# Patient Record
Sex: Male | Born: 1980 | Race: White | Hispanic: No | Smoking: Current every day smoker
Health system: Southern US, Community
[De-identification: ages and names within clinical notes are randomized; demographics above are authoritative.]

## PROBLEM LIST (undated history)

## (undated) DIAGNOSIS — F32A Depression, unspecified: Secondary | ICD-10-CM

## (undated) DIAGNOSIS — K219 Gastro-esophageal reflux disease without esophagitis: Secondary | ICD-10-CM

## (undated) DIAGNOSIS — H9190 Unspecified hearing loss, unspecified ear: Secondary | ICD-10-CM

## (undated) DIAGNOSIS — F1010 Alcohol abuse, uncomplicated: Secondary | ICD-10-CM

## (undated) DIAGNOSIS — F419 Anxiety disorder, unspecified: Secondary | ICD-10-CM

## (undated) DIAGNOSIS — F410 Panic disorder [episodic paroxysmal anxiety] without agoraphobia: Secondary | ICD-10-CM

## (undated) DIAGNOSIS — K769 Liver disease, unspecified: Secondary | ICD-10-CM

## (undated) DIAGNOSIS — F329 Major depressive disorder, single episode, unspecified: Secondary | ICD-10-CM

## (undated) HISTORY — DX: Panic disorder (episodic paroxysmal anxiety): F41.0

## (undated) HISTORY — PX: COCHLEAR IMPLANT: SHX184

## (undated) HISTORY — DX: Alcohol abuse, uncomplicated: F10.10

## (undated) HISTORY — DX: Gastro-esophageal reflux disease without esophagitis: K21.9

## (undated) HISTORY — DX: Liver disease, unspecified: K76.9

## (undated) HISTORY — PX: COLON SURGERY: SHX602

## (undated) HISTORY — PX: OTHER SURGICAL HISTORY: SHX169

---

## 2006-01-16 ENCOUNTER — Other Ambulatory Visit: Payer: Self-pay

## 2006-01-16 ENCOUNTER — Emergency Department: Payer: Self-pay | Admitting: Emergency Medicine

## 2006-10-24 ENCOUNTER — Emergency Department: Payer: Self-pay | Admitting: Emergency Medicine

## 2006-10-24 ENCOUNTER — Other Ambulatory Visit: Payer: Self-pay

## 2006-11-23 ENCOUNTER — Other Ambulatory Visit: Payer: Self-pay

## 2006-11-23 ENCOUNTER — Emergency Department: Payer: Self-pay | Admitting: Emergency Medicine

## 2007-01-07 ENCOUNTER — Ambulatory Visit: Payer: Self-pay | Admitting: Gastroenterology

## 2007-02-25 ENCOUNTER — Ambulatory Visit: Payer: Self-pay | Admitting: Family Medicine

## 2007-02-28 ENCOUNTER — Ambulatory Visit: Payer: Self-pay | Admitting: Family Medicine

## 2007-12-08 ENCOUNTER — Ambulatory Visit: Payer: Self-pay | Admitting: Family Medicine

## 2008-05-22 IMAGING — CT CT ABDOMEN W/ CM
1 of 2 series · 15 of 32 positions shown, 19 images · non-contrast
Comparison: none

REASON FOR EXAM: abd pain    dialated common bile duct
COMMENTS:

[Series 2: soft tissue · axial · 0.78mm/px · z∈[-72,+248]mm · 15 of 89 slices shown, 19 images]
[im 5/89  soft-tissue]
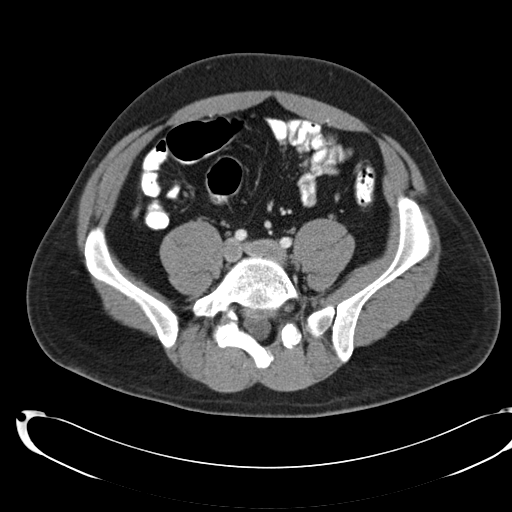
[im 5/89  bone]
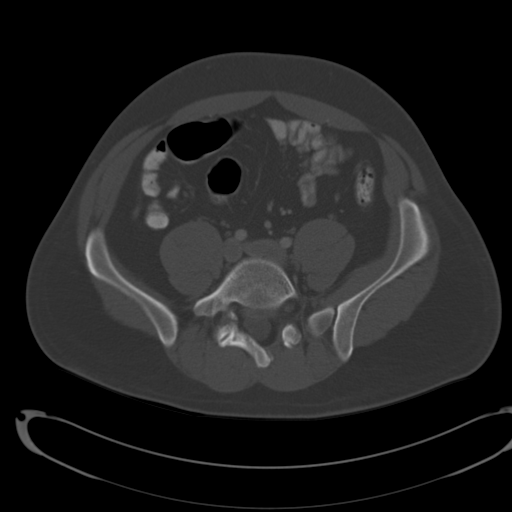
[im 13/89  soft-tissue]
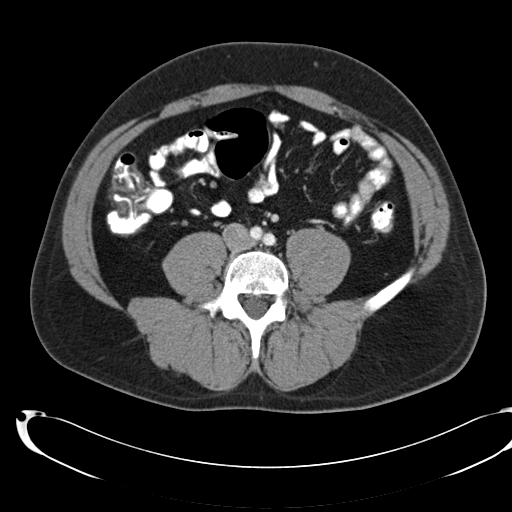
[im 21/89  soft-tissue]
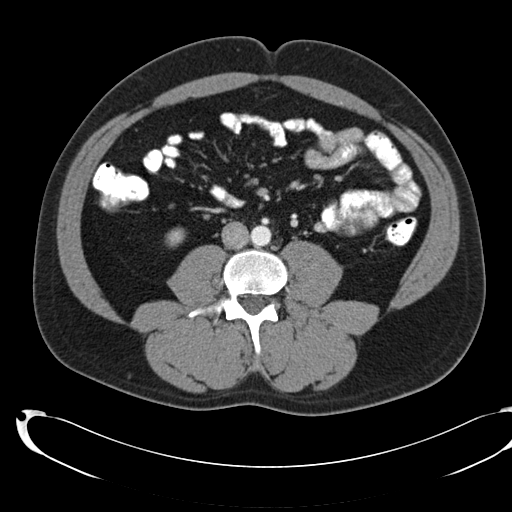
[im 25/89  soft-tissue]
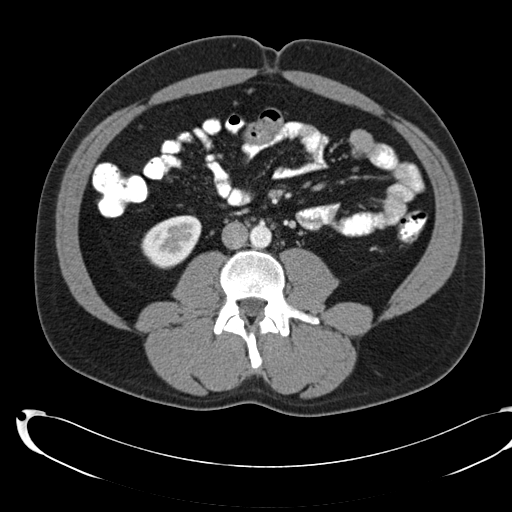
[im 33/89  soft-tissue]
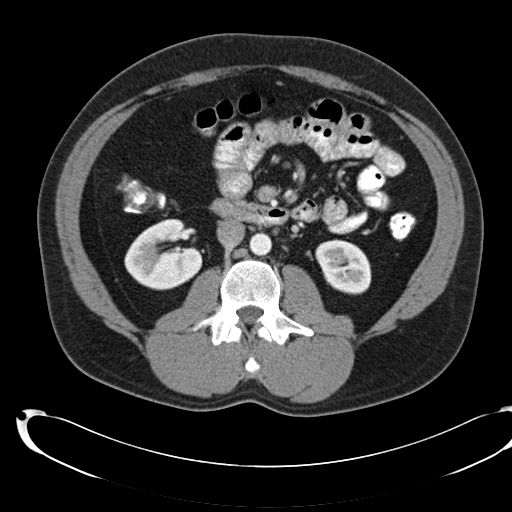
[im 37/89  soft-tissue]
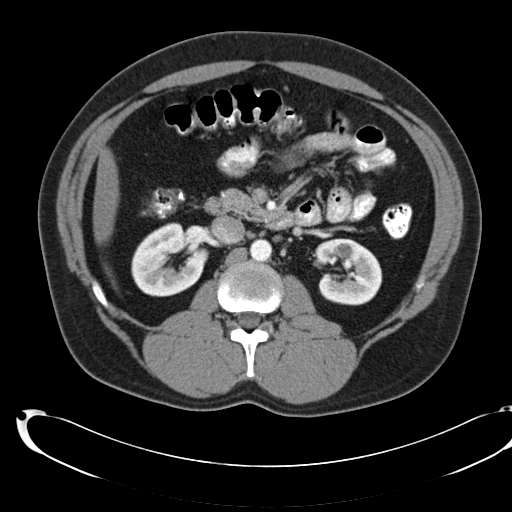
[im 45/89  soft-tissue]
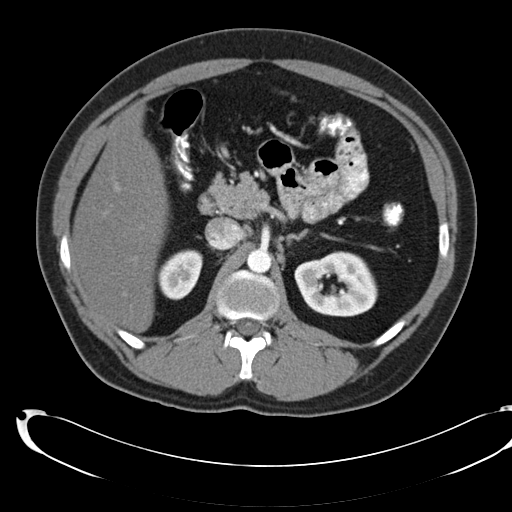
[im 53/89  soft-tissue]
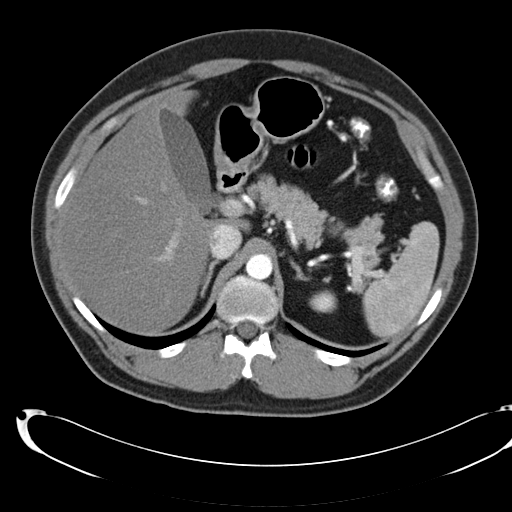
[im 57/89  soft-tissue]
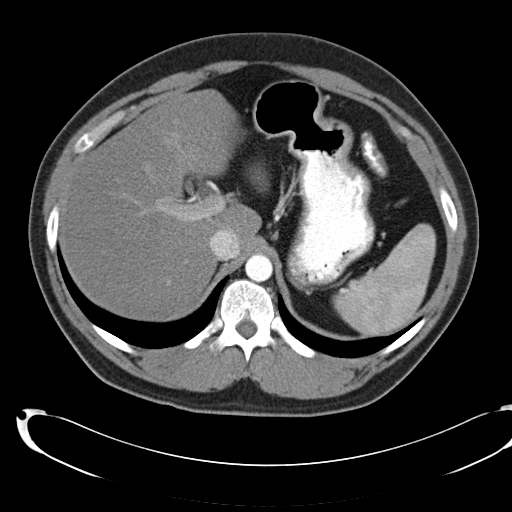
[im 57/89  bone]
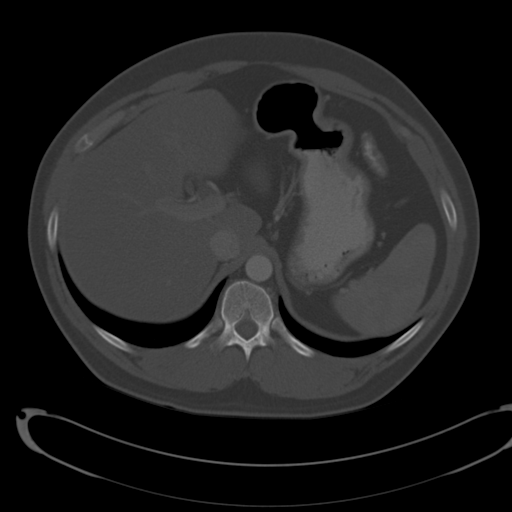
[im 65/89  soft-tissue]
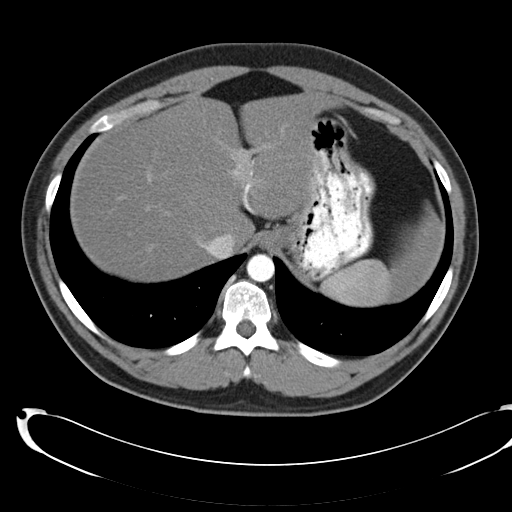
[im 69/89  soft-tissue]
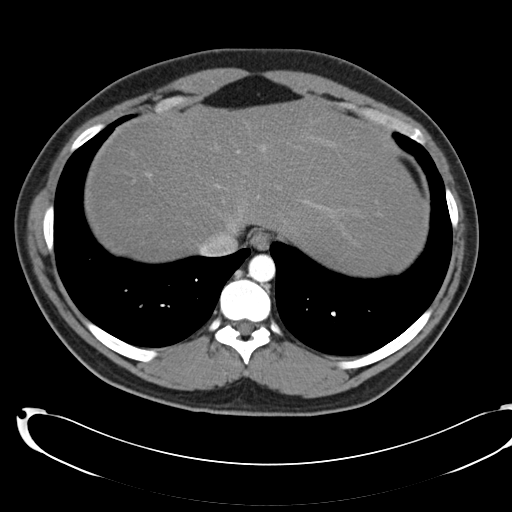
[im 73/89  lung]
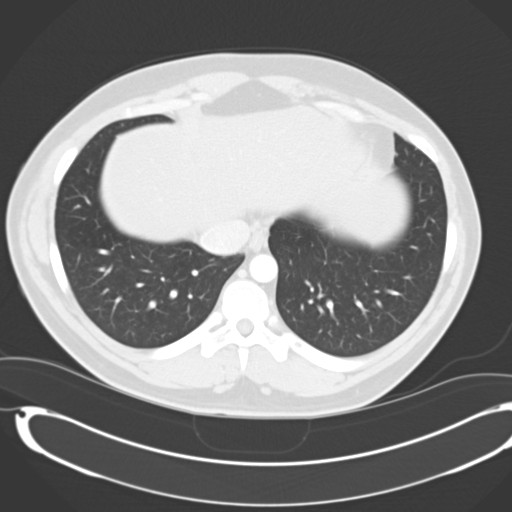
[im 77/89  soft-tissue]
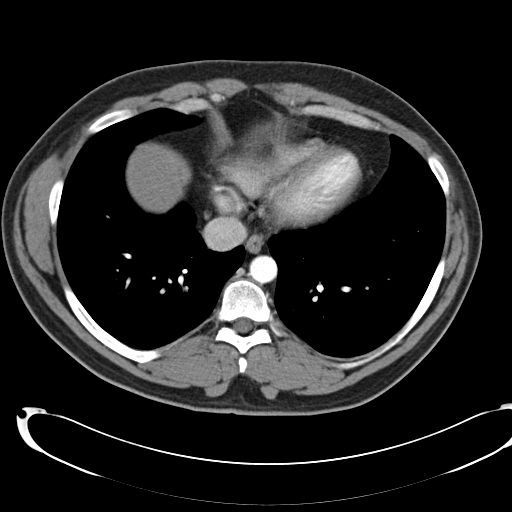
[im 77/89  lung]
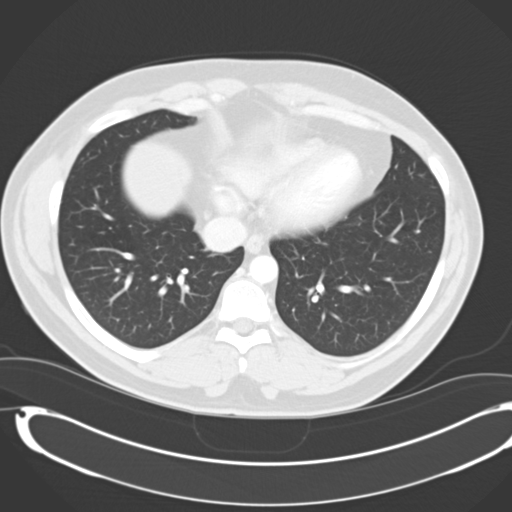
[im 81/89  lung]
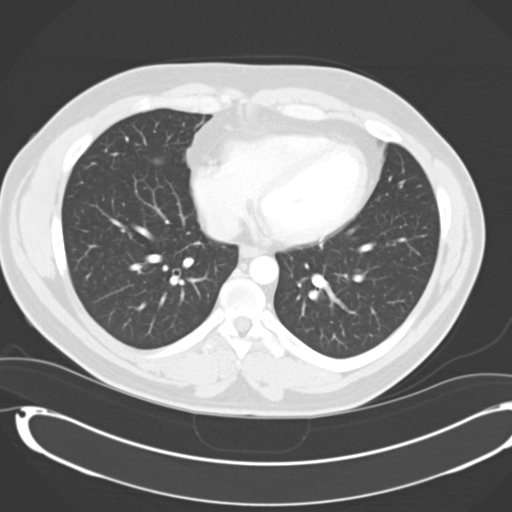
[im 85/89  soft-tissue]
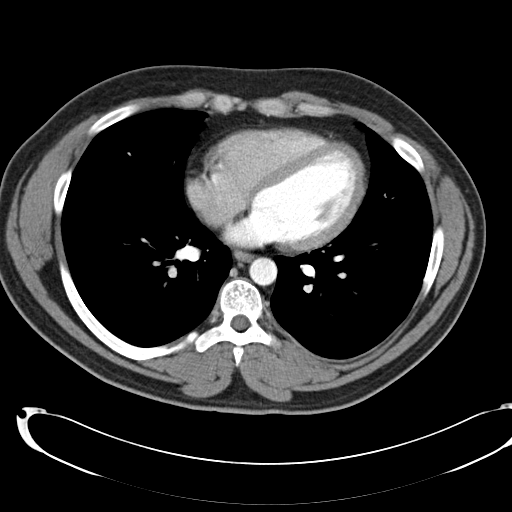
[im 85/89  lung]
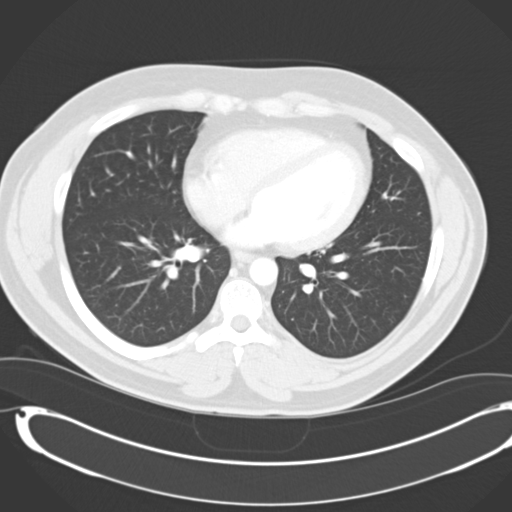

[15 of 32 positions shown; findings below may reference images not displayed]

PROCEDURE:     CT  - CT ABDOMEN COMPLEX W  - February 28, 2007  [DATE]

RESULT:       Helical 4-mm sections were obtained from the lung bases
through the superior iliac crest status post intravenous administration of
80 ml of Tsovue-VMS and oral contrast.

Evaluation of the lung bases demonstrates no gross abnormalities. The liver
demonstrates a diffuse low attenuating architecture. No focal masses or
regions of abnormal enhancement are identified. The spleen, adrenals and
kidneys are unremarkable. Evaluation of the pancreas demonstrates no
evidence of abnormal parenchymal enhancement or evidence of focal masses,
peripancreatic fluid or drainable loculated fluid collections. The pancreas
appears to demonstrate a normal undulating border. The pancreatic duct does
not appear to be dilated. There does not appear to be CT evidence of
intrahepatic biliary ductal dilatation. The common bile duct is prominent at
approximate 8-mm in diameter correlating with the patient's ultrasound
findings. No calcified gallstones are demonstrated within the gallbladder or
within the visualized portions of the common bile duct. The celiac, SMA,
portal vein, SMV and IMA are patent. There is no evidence of abdominal free
fluid, drainable loculated fluid collections, masses or adenopathy.
The visualized portions of the bowel demonstrates no evidence of
obstruction, colitis or enteritis.
IMPRESSION: 1.     Findings consistent with hepatic steatosis.
2.     There is no CT evidence of pancreatitis or pancreatic masses.

## 2008-09-07 ENCOUNTER — Ambulatory Visit: Payer: Self-pay | Admitting: Unknown Physician Specialty

## 2008-09-10 ENCOUNTER — Ambulatory Visit: Payer: Self-pay | Admitting: Gastroenterology

## 2008-10-09 ENCOUNTER — Ambulatory Visit: Payer: Self-pay | Admitting: Gastroenterology

## 2008-10-18 ENCOUNTER — Ambulatory Visit (HOSPITAL_COMMUNITY): Admission: RE | Admit: 2008-10-18 | Discharge: 2008-10-19 | Payer: Self-pay | Admitting: Otolaryngology

## 2009-03-15 ENCOUNTER — Emergency Department: Payer: Self-pay | Admitting: Emergency Medicine

## 2009-10-16 ENCOUNTER — Ambulatory Visit: Payer: Self-pay | Admitting: Family Medicine

## 2010-11-27 LAB — CBC
MCV: 86.8 fL (ref 78.0–100.0)
Platelets: 273 10*3/uL (ref 150–400)
RBC: 4.79 MIL/uL (ref 4.22–5.81)
WBC: 6.7 10*3/uL (ref 4.0–10.5)

## 2010-11-27 LAB — COMPREHENSIVE METABOLIC PANEL
ALT: 88 U/L — ABNORMAL HIGH (ref 0–53)
AST: 39 U/L — ABNORMAL HIGH (ref 0–37)
Albumin: 4 g/dL (ref 3.5–5.2)
Calcium: 9.6 mg/dL (ref 8.4–10.5)
Chloride: 107 mEq/L (ref 96–112)
Creatinine, Ser: 1.16 mg/dL (ref 0.4–1.5)
GFR calc Af Amer: >60 mL/min (ref >60)
Sodium: 141 mEq/L (ref 135–145)

## 2010-11-27 LAB — DIFFERENTIAL
Eosinophils Absolute: 0.3 10*3/uL (ref 0.0–0.7)
Eosinophils Relative: 4 % (ref 0–5)
Lymphocytes Relative: 30 % (ref 12–46)
Lymphs Abs: 2 10*3/uL (ref 0.7–4.0)
Monocytes Absolute: 0.6 10*3/uL (ref 0.1–1.0)

## 2010-11-27 LAB — URINALYSIS, ROUTINE W REFLEX MICROSCOPIC
Glucose, UA: NEGATIVE mg/dL
Specific Gravity, Urine: 1.027 (ref 1.005–1.030)
pH: 7.5 (ref 5.0–8.0)

## 2010-12-30 NOTE — Op Note (Signed)
NAME:  [name], [name] NO.:  [id number]   MEDICAL RECORD NO.:  [id number]          PATIENT TYPE:  OIB   LOCATION:  5526                         FACILITY:  [facility]   PHYSICIAN:  Carolan Shiver, M.D.    DATE OF BIRTH:  Apr 10, 1981   DATE OF PROCEDURE:  DATE OF DISCHARGE:                               OPERATIVE REPORT   JUSTIFICATION FOR PROCEDURE:  Jonathan [name] is a 30 year old white male  here today for a right cochlear implant to treat profound bilateral  sensorineural hearing loss.  Jonathan [name] first presented to see me on  August 27, 2003, at that time he had a 5-year history of progressive  hearing loss.  He stated that he was having great difficulty hearing low  pitches.  He had some childhood ear infections, but denied any head  trauma or previous ear procedures.  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  He had been seen by Dr. Andee Poles in  [place] and was told that he should wear BTE hearing aids, but  elected to purchase in-the-ear hearing aids.  At that time, he was not  wearing them.  On physical examination, he was found to have intact  tympanic membranes.  Audiometric testing at that time documented steeply  sloping and profound high-frequency sensorineural hearing losses.  He  had speech reception threshold of 60% in his left ear and we did not  test discrimination in the right ear.  Over the last 5 years, he went  onto lose most of his hearing in the high frequencies as well as hearing  in the low frequencies in the right ear, continued to have a low  shoulder audiogram on the left.  Preop audiometric testing documented  speech reception threshold to 75 dB right ear with 12% discrimination  and 65 dB SRT in the left with 28% discrimination.  He met all criteria  for implantation with a multichannel cochlear implant right ear.   He underwent CT scanning of his  temporal bones on March 06, 2008, at  [facility].  He was found to have normal temporal bone  anatomy with patent cochleas.  ENG was performed which showed some  borderline normal tracking.  He had 38% difference between his right and  left ears on vestibular testing.  With ice water calorics, he had 50  degrees maximum slow-wave velocity in right ear versus 31 degrees  maximum slow-wave velocity in left ear.  He had excellent visual  fixation suppression present for both ears.   The patient and his wife were extensively counseled that he could do  nothing or consider monaural cochlear implant followed by eventually  binaural cochlear implantation.  He elected to proceed with a binaural  cochlear implant right ear.   Risks, complications, and alternatives were explained to the patient and  to his wife.  Questions were invited and answered and informed consent  was signed and witnessed.  He was extensively counseled preoperatively  watch the  [employer] DVD presentation concerning multichannel  cochlear implantation.  He understood that we could not guarantee that  he would be able to hear with a cochlear implant and that there were  both low medium and high performers.  He understood it would take up to  24 months for him to learn how to use the device and again that there  were no guarantees that he would regain any useful hearing.  He  understood this and wanted to proceed.  He read our three-page informed  consent on cochlear implant because he could not hear, but he could  read.  He was recommended for Pneumovax vaccine.   His procedure was scheduled for October 18, 2008, at [facility] OR under  general endotracheal anesthesia.   PREOPERATIVE DIAGNOSIS:  Profound bilateral sensorineural deafness.   POSTOPERATIVE DIAGNOSIS:  Profound bilateral sensorineural deafness.   OPERATION:  Advanced Bionics HiRes 90K with one J electrode multichannel  cochlear implant, right  ear.   SURGEON:  Carolan Shiver, MD   ANESTHESIA:  General endotracheal, Dr. Laverle Hobby, MD   COMPLICATIONS:  None.   SUMMARY OF REPORT:  After the patient was taken to the operating room,  he was placed in the supine position.  An IV had been begun in the preop  holding area.  General IV induction was then performed under the  guidance of Dr. Laverle Hobby, and the patient was orally intubated.  Eyelids were taped shut, and he was properly positioned and monitored.  Elbows and ankles were padded with foam rubber, and a Foley catheter was  inserted.  PAS stockings were applied.   The hair was clipped in the left postauricular area, the hair was taped,  and a stocking cap was applied.  A lazy S-incision was then made in the  right postauricular area in a standard fashion for a cochlear implant.  The area was then infiltrated with 10 mL of 1% Xylocaine with 1:100,000  epinephrine.  Silver wire needle electrodes were inserted into the right  orbicularis oris and oculi muscles, the suprasternal notch, and  connected to a NIM facial nerve preamplifier.  The patient's right ear  and hemiface and scalp were then prepped with Betadine and draped in a  standard fashion for a multichannel cochlear implant.   A right postauricular incision was then made and carried down to  mucoperiosteum.  Superior portion of the incision was then made, carried  down to temporalis muscle.  A anteriorly based temporalis muscle flap  was then elevated and the mastoid cortex was exposed.  The mastoid  cortex was then removed with cutting burs using continuous suction  irrigation.  The sigmoid sinus was identified as was the antrum.  The  digastric periosteum was identified and the posterior canal wall was  thinned.  The antrum was opened and the incus was identified in the  fossa incudis.  The facial recess was then opened with a 2-mm cutting  bur and then a 2-mm diamond bur.  He had normal anatomy with a   pneumatized facial recess.  The round window niche was identified.  A  cochleostomy was then performed anterior to the round window niche and  the scala tympani was egg shelled.  The bone was then removed, and the  cochleostomy was enlarged to the size of the plastic insertion tube.  This was also checked with a sizer.  The area was then packed with  cottonoid soaked in saline.   Attention was  then turned to the temporoparietal skull.  A EBC ICS  template was then used to mark a bony well.  Great care was taken to  make sure the ICS was moved posteriorly enough so that he could wear an  external processor without difficulty.  A well was drilled in the bone  and bleeding was controlled with bone wax.  A trough was drilled from  the anterior portion of the well to the posterosuperior portion of the  mastoid cavity.  All edges were smoothed and the posterior edge of the  mastoid cavity was undercut.  A site was then copiously irrigated with  bacitracin-containing saline.  All bleeding was controlled.  Gowns and  gloves were changed.   An Advanced Bion[employer] HiRes 90K cochlear implant with a one J electrode,  serial [id number].  Sterilization [phone] was then opened under saline and  then inspected under the microscope.  The ICS appeared to be intact.  The implant was then placed in the postauricular pocket with the  titanium can of the ICS in the bony well.  Two sets of holes had been  previously drilled on either side of the bony well with a 1-mm cutting  bur.  The cochlear implant was sutured into the bone using a 5-0  Surgilon suture.   The plastic insertion tube was then mounted on an insertion tool with  the collar of the insertion tube fully mounted to the collar of the  insertion tool.  Slot was oriented at 10 o'clock.  Insertion tube was  then inserted into the at basilar turn of the cochlea and the implant  was gently inserted without tension into the cochlea.  The insertion  tube was  disengaged from the electrode array without difficulty with the  aid of a straight claw.  The J-hook portion of the electrode array was  at the cochleostomy.  Cochleostomy was then packed and obliterated with  small fascia rectangles and then larger pieces of fascia.  The facial  recess was then obliterated with 2 muscle plugs.  These were then held  in place with Gelfoam soaked in bacitracin saline.  Two other rectangles  of Gelfoam were used to stabilize the electrode array within the mastoid  cavity.  Proximal portion of the electrode array was placed under the  undercut shelf of the posterior edge of the mastoid cavity.  The muscle  flap was then closed over the electrode array that had been placed in  the bony trough.  It was sutured into place with interrupted 3-0  Monocryl.  Skin was then closed in 2 layers using interrupted inverted 3-  0 Monocryl for subcutaneous watertight closure and skin was closed with  interrupted skin staples.  Intraoperative x-ray was taken and showed the  electrode array to be within good position in the cochlea.   The incision was then painted with bacitracin ointment.  The ear was  padded with Telfa and cotton.  I should mention that a #7 Jackson-Pratt  drain had been inserted through a separate stab incision, sutured into  place with a 2-0 silk suture prior to closing the incision.  A JP drain  was connected to a grenade suction.  The ear was padded with Telfa and  cotton, and standard adult Glasscock mastoid dressing was applied  loosely in the standard fashion.  The patient's Foley catheter was  removed.  He was awakened, extubated, and transferred to his hospital  bed.  He appeared to tolerate the general  endotracheal anesthesia and  the procedures well and left the operating room in stable condition.   TOTAL FLUIDS:  2 L.   TOTAL URINE OUTPUT:  225 mL.   ESTIMATED BLOOD LOSS:  Less than 30 mL.   Sponge, needle, and cotton ball counts were  correct at the termination  of the procedure.  There were no complications.   Jonathan [name] will be admitted to the PACU and then to a private room.  He  will be observed overnight and discharged on October 19, 2008, if stable.  His wife will be instructed to have him return to my office in  approximately 7-10 days for followup and staple removal.  His external  processor will be connected and programmed in approximately 3 weeks.   SUMMARY:  Uncomplicated [employer]ion[employer] cochlear implant right ear with  a HiRes 90K with one J electrode ICS serial [id number].  Full-length  insertion was accomplished.  The patient had a pneumatized facial  recess.  The facial nerve was not exposed.  Middle ear anatomy was  normal.  An intraoperative x-ray documented proper insertion in the  electrode array within the cochlea.  The ICS was sutured to bone with a  5-0 Nurolon.  There were no complications.      Carolan Shiver, M.D.  Electronically Signed     EMK/MEDQ  D:  10/18/2008  T:  10/19/2008  Job:  [id number]

## 2010-12-30 NOTE — H&P (Signed)
NAME:  Jonathan [name], TULLY NO.:  [id number]   MEDICAL RECORD NO.:  [id number]          PATIENT TYPE:  AMB   LOCATION:                               FACILITY:  [facility]   PHYSICIAN:  Carolan Shiver, M.D.    DATE OF BIRTH:  07/30/1981   DATE OF ADMISSION:  10/18/2008  DATE OF DISCHARGE:                              HISTORY & PHYSICAL   CHIEF COMPLAINT:  Profound bilateral sensorineural hearing loss.   HISTORY OF PRESENT ILLNESS:  Jonathan [name] is a 30 year old white  male, being admitted to [facility] on October 18, 2008, for a  multichannel cochlear implant, right ear, to treat profound bilateral  sensorineural hearing loss.  The etiology of [name] hearing loss is  unknown.  It has been slow and progressive.  He was first evaluated by  me on August 27, 2003.  He had had a 5-year history of progressive  hearing loss at that time.  Therefore, he has had a 10-year history of  progressive hearing loss.  He had had some childhood ear infections but  denied any ear operations or head trauma.  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  He had been fit with binaural hearing  aids, which eventually provided him with no benefit.  Over the last 5  years, he has gone on to lose most of his hearing.  He still had a small  amount of residual hearing in his left ear and essentially no hearing in  the right ear.  Preop audiometric testing on October 16, 2008, documented  speech reception threshold to 75 dB, right ear with 12% discrimination  and 65 dB SRT in the left ear with 28% discrimination.  He was,  otherwise, in good health.  Over the last several years, he had been  counseled extensively concerning the risks, benefits, and alternatives,  a multichannel cochlear implantation, and he elected to proceed with an  implant in the right ear.  His hearing aids were providing no benefit.  Preop ENG on March 09, 2008, documented 38% difference in ice water  caloric function of each labyrinth.  He had a 50 degree per second right  ear, 31 degrees per second left ear.  CT scan of his temporal bones  which has been performed on March 06, 2008, showed normal left temporal  bones and patent cochleas.   [name] and his wife are extensively counseled concerning the risks,  benefits, and alternatives of multichannel cochlear implantation right  ear.  He had watched the advanced bionics DVD and had been supplied with  all of the advanced bionics information.  Over the last year, he had  been extensively counseled and understood that there were no guarantees  that he would hear with a cochlear implant.  He also understood that  there were one third patients were low performer and one third medium  performers and a third high performed and that I did not know in  which  category he would fall.   Because he cannot hear but he can read, he was given a 3-page written  informed consent to read prior to the procedure and he agreed to proceed  with the operation.   The patient was counseled on October 16, 2008, that risks and complications  included but were not limited to infection, bleeding, reaction to the  anesthesia, partial or total loss of hearing, facial weakness or  paralysis, transient or prolonged dizziness, transient or prolonged  taste disturbance, transient or prolonged tinnitus.  Possible failure of  the internal hardware and need for explantation and reimplantation and  the fact that we could not guarantee any hearing result with the  implant.  He understood all of this and wanted to proceed with the  procedure.  Procedure is scheduled for October 18, 2008, at 7:30 a.m. at  [facility] Operating Room under general endotracheal anesthesia.   PAST MEDICAL HISTORY:  No serious illnesses or operations reported.   MEDICATIONS:  Nexium and Valium p.r.n.   ALLERGIES TO  MEDICATIONS:  None reported.   FAMILY HISTORY:  Positive for diabetes, heart disease, and hearing loss.   SOCIAL HISTORY:  He is married, works as a Estate agent, has some  college education, smokes 1-2 times per week, does drink alcohol 1-2  times per month.   REVIEW OF SYSTEMS:  Positive for his progressive hearing loss, high  cholesterol, and reflux.   PHYSICAL EXAMINATION:  GENERAL:  He was awake, alert, and stable.  HEENT:  His head was normocephalic with bilateral symmetric facial  motion.  He had no signs of any syndromes.  He was markedly hearing  impaired.  Eyes, PERRLA with EOMI.  External ears and canals were  stable.  TMs were clear and mobile bilaterally.  Nose was negative.  External and internally oral cavity, lips, and palate normal.  NECK:  Negative.  CHEST:  Clear to percussion and auscultation.  HEART:  Normal sinus rhythm.  ABDOMEN:  Benign.  GENITALIA AND RECTAL:  Not done.  EXTREMITIES:  Unremarkable.  NEUROLOGIC:  Physiologic with the exception of a very poor function of  his eighth cranial nerves bilaterally.   He appeared to have realistic expectations.  Again, his wife accompanied  him to the examination.   LABORATORY DATA:  From October 17, 2008, hemoglobin was 14.4, hematocrit  41.6, white blood cell count 6700, platelet count was 273,000.  PT was  13.2, PTT 27, INR 1.0.  Serum electrolytes were within normal limits  with potassium of 4.3.  A urinalysis was unremarkable.  Chest x-ray and  EKG results are pending.   Preop CT scan of his temporal bones on March 06, 2008, at [facility] revealed normal temporal bone anatomy within patent cochleae  bilaterally and normal internal auditory canals.  Preop ENG on March 09, 2008, at the [facility] revealed a normal ENG with some borderline  normal tracking.  On ice water caloric irrigation of his right ear, he  had a 50 degree maximum slow wave velocity, 50 degrees per second  maximum slow wave  velocity right ear and 31 degree per second maximum  slow wave velocity left ear with a 38% difference between the 2 ears.   IMPRESSION:  Profound bilateral sensorineural deafness, right greater  than left, with failure of hearing aids to provide any benefit.   RECOMMENDATIONS:  The patient is a candidate for a multichannel cochlear  implant, right ear, under general  endotracheal anesthesia with a 23-hour  overnight observation.  Again risks, complications, and alternatives  were explained to him and to his wife.  He also read our 3-page cochlear  implant informed consent form and signed our consent.  He is scheduled  for the morning of October 18, 2008, at [facility] [place], under general  endotracheal anesthesia.      Carolan Shiver, M.D.  Electronically Signed    EMK/MEDQ  D:  10/17/2008  T:  10/18/2008  Job:  [id number]

## 2011-01-02 NOTE — Discharge Summary (Signed)
NAME:  Jonathan, [name] NO.:  [id number]   MEDICAL RECORD NO.:  [id number]          PATIENT TYPE:  OIB   LOCATION:  5526                         FACILITY:  [facility]   PHYSICIAN:  Carolan Shiver, M.D.    DATE OF BIRTH:  1981/08/02   DATE OF ADMISSION:  10/18/2008  DATE OF DISCHARGE:  10/19/2008                               DISCHARGE SUMMARY   ADMISSION DIAGNOSIS:  Profound bilateral sensorineural hearing loss,  unable to be helped by hearing aids.   DISCHARGE DIAGNOSIS:  Profound bilateral sensorineural hearing loss,  unable to be helped by hearing aids.   OPERATION:  Advanced Bionics cochlear implant, right ear (HiRes 90 K  with 1J electrode).   SURGEON:  Carolan Shiver, MD   ANESTHESIA:  General endotracheal, Dr. Diamantina Monks.   COMPLICATIONS:  None.   DISCHARGE STATUS:  Stable.   SUMMARY OF HOSPITALIZATION:  Jonathan [name] is a 30 year old white male  who was admitted to [facility] on October 18, 2008, for an  advanced Bionics multichannel cochlear implant right ear (HiRes 90 K  with 1J electrode) for treatment of profound bilateral sensorineural  hearing loss.  Jonathan [name] had had progressive sensorineural hearing loss  of unknown etiology.  He eventually became unable to wear hearing aids  in either ear.  On October 16, 2008, audiometric testing showed an SRT of  75 dB in his right ear with 12% discrimination.  His left ear had a 65  dB SRT with 28% discrimination.  He had a low __________ audiogram on  the left and essentially no hearing on the right.  He underwent  extensive preoperative counseling concerning the risks, benefits, and  alternatives to multichannel cochlear implantation.  He was postlingual  and met all the criteria for cochlear implantation.  A CT scan of his  temporal bones documented patent cochleas.  He received Pneumovax  vaccine preoperatively.   On the morning of October 18, 2008, he was taken to [facility] operating  room #3 and  underwent an uncomplicated insertion implantation of an  advanced biotics multichannel cochlear implant into his right cochlea.  This was a HiRes 90 K device with a 1J electrode serial number [id number],  model number [id number].  Full length insertion was accomplished without  complications.   The patient was admitted to the PACU and into room 5526.  He had an  uncomplicated postoperative course.  He remained awake, alert, and  stable during the first postoperative evening and intact facial function  and no nystagmus.   By the first postoperative morning October 19, 2008, he was ambulating.  He  was awake and alert, had no nystagmus and intact facial function.  His  Jackson-Pratt drain was removed without difficulty.   The patient was recommended for discharge home with his family and wife  on the morning of October 19, 2008.   DISCHARGE MEDICATIONS:  Cefzil 500 mg p.o. b.i.d. x10 days with food,  which was eventually changed to Augmentin XR 1 p.o. b.i.d. x10 days,  Percocet 5/325 one to two p.o. q.4  h. p.r.n. pain #30 tablets, Valium 5  mg p.o. t.i.d. p.r.n. vertigo #20 tablets, and Phenergan suppositories  25 mg 1 p.o. q.6 h. p.r.n. nausea #2 suppositories.   He was instructed to keep his head elevated avoid water exposure for the  next few days.  Follow a regular diet and avoid heavy lifting or  straining.  He was instructed to return to my office in 1 week for  followup and staple removal.  He was to call [phone] for any  postoperative problems directly related to the procedure.  He was given  both verbal and written instructions.      Carolan Shiver, M.D.  Electronically Signed     EMK/MEDQ  D:  10/24/2008  T:  10/25/2008  Job:  [id number]

## 2012-07-02 LAB — COMPREHENSIVE METABOLIC PANEL
Anion Gap: 7 (ref 7–16)
Calcium, Total: 9.2 mg/dL (ref 8.5–10.1)
Chloride: 101 mmol/L (ref 98–107)
Co2: 27 mmol/L (ref 21–32)
EGFR (African American): >60
EGFR (Non-African Amer.): >60
Glucose: 96 mg/dL (ref 65–99)
Osmolality: 270 (ref 275–301)
Potassium: 3.9 mmol/L (ref 3.5–5.1)
SGOT(AST): 45 U/L — ABNORMAL HIGH (ref 15–37)
Sodium: 135 mmol/L — ABNORMAL LOW (ref 136–145)

## 2012-07-02 LAB — URINALYSIS, COMPLETE
Bacteria: NONE SEEN
Bilirubin,UR: NEGATIVE
Leukocyte Esterase: NEGATIVE
Ph: 6 (ref 4.5–8.0)
RBC,UR: <1 /HPF (ref 0–5)
Squamous Epithelial: NONE SEEN
WBC UR: 1 /HPF (ref 0–5)

## 2012-07-02 LAB — DRUG SCREEN, URINE
Amphetamines, Ur Screen: NEGATIVE (ref ?–1000)
Cocaine Metabolite,Ur ~~LOC~~: NEGATIVE (ref ?–300)
Methadone, Ur Screen: NEGATIVE (ref ?–300)
Opiate, Ur Screen: NEGATIVE (ref ?–300)
Phencyclidine (PCP) Ur S: NEGATIVE (ref ?–25)

## 2012-07-02 LAB — ETHANOL
Ethanol %: <0.003 % (ref 0.000–0.080)
Ethanol: <3 mg/dL

## 2012-07-02 LAB — CBC
HCT: 44.8 % (ref 40.0–52.0)
MCHC: 34.8 g/dL (ref 32.0–36.0)
RDW: 12.8 % (ref 11.5–14.5)

## 2012-07-02 LAB — ACETAMINOPHEN LEVEL: Acetaminophen: <2 ug/mL

## 2012-07-04 ENCOUNTER — Inpatient Hospital Stay: Payer: Self-pay | Admitting: Psychiatry

## 2014-12-04 NOTE — H&P (Signed)
PATIENT NAME:  Jonathan [name], Jonathan [name] MR#:  [id number] DATE OF BIRTH:  June 05, 1981  DATE OF ADMISSION:  07/04/2012  IDENTIFYING INFORMATION AND CHIEF COMPLAINT: 34 year old man who came to the Emergency Room requesting detox from alcohol.   CHIEF COMPLAINT: "It's just that alcohol."   HISTORY OF PRESENT ILLNESS: Information obtained from the patient and from his wife as well as from the chart. The patient states that Jonathan [name] has always has been a heavy drinker, but that recently it has gotten worse. Jonathan [name] has been consuming up to a fifth of liquor per day, sometimes more than that. Jonathan [name] has started to have blackouts regularly. Jonathan [name] has frequently had times where Jonathan [name] has forgotten an entire evening of drinking. It is affecting his mood, making him feel more depressed and erratic, affecting his relationship with his wife. Jonathan [name] has had two DWIs in the last couple of months. His mood has been depressed. Jonathan [name] denies any suicidal ideation, denies any psychotic symptoms. Jonathan [name] denies that Jonathan [name] has been abusing other drugs but then mentions that Jonathan [name] has been taking his Valium more than usual. Jonathan [name] has prescription Valium to be used p.r.n. and recently Jonathan [name] has been using it up to twice a day as well as using it while Jonathan [name] is drinking. Jonathan [name] comes to the hospital seeking treatment for alcohol dependence. The patient's wife believes that his problem is more one of depression. She thinks that Jonathan [name] has been depressed and that that is why Jonathan [name] is drinking. She seems to minimize the degree of drinking problem that Jonathan [name] is having.   PAST PSYCHIATRIC HISTORY: The patient has had a long-standing drinking problem, but never really tried to stop in the past. Never been through detox, never been in any kind of substance abuse program. Jonathan [name] has a history of anxiety disorder with panic attacks. Jonathan [name] had been treated with Paxil by me in the outpatient clinic and had good response to Paxil and only occasional use of Valium. No history of suicide attempts. No history of significant  violence or previous hospitalizations.   PAST MEDICAL HISTORY:  1. Cochlear implant.  2. Gastric reflux symptoms. 3. High blood pressure.   SOCIAL HISTORY: The patient is married and has one son. Jonathan [name] generally has a good relationship with his wife. Jonathan [name] works regularly. Does not report any major new stress in his life except related to the DWIs.   CURRENT MEDICATIONS:  1. Crestor 5 mg per day.  2. Protonix 40 mg per day.  3. Paxil 40 mg per day.  4. Valium 5 to 10 mg 1 or 2 times a day p.r.n.   ALLERGIES: No known drug allergies.   FAMILY HISTORY: Positive for anxiety.   REVIEW OF SYSTEMS: The patient complains of shakiness, mild nausea, and general feelings of anxiety and low mood, but no suicidal or homicidal ideation. No hallucinations or delusional thinking. Jonathan [name] has been having more difficulty with erratic sleeping. Appetite is okay.   MENTAL STATUS EXAM: Mildly disheveled man, looks his stated age, cooperative with the interview. Good eye contact, normal psychomotor activity. Speech is of normal rate, tone, and volume. Affect euthymic, reactive and appropriate. Mood stated as being okay. Thoughts are lucid without any loosening of associations. No sign of delusional thinking. Denies suicidal or homicidal ideation. Appears to be of average intelligence. Adequate current judgment and insight. Short and long term memory showing some impairment, specifically related to alcohol use.   PHYSICAL EXAMINATION:  GENERAL: Does not appear to be in any acute distress.  No gross tremor noted.   HEENT: Pupils equal and reactive. Face symmetric. Jonathan [name] has a cochlear implant with a hearing assisting device on the right side, otherwise head is symmetric.   NECK AND BACK: Nontender.   MUSCULOSKELETAL: Full range of motion at all extremities. Normal gait.   LUNGS: Clear with no wheezes.   HEART: Regular rate and rhythm.   ABDOMEN: Soft, nontender, normal bowel sounds.   NEUROLOGICAL: Strength and  reflexes normal symmetrically upper and lower.   VITAL SIGNS: Temperature 97.7, pulse 96, respirations 20, blood pressure 156/84.   ASSESSMENT:  34 year old man with alcohol dependence and anxiety disorder and depression. Currently having fewer depressed symptoms. Has good insight about his heavy alcohol abuse and wants to get treatment both for his own good and for legal reasons. Motivated for treatment. Currently physically stable, cooperative, not acutely suicidal.   TREATMENT PLAN: While in the Emergency Room arrangements were already made for referral to the alcohol and drug abuse treatment center in [place]. Jonathan [name] has been accepted there and has a bed available tomorrow. At this point I am not going to continue his standing benzodiazepines but will continue his other medication with the plan that Jonathan [name] is likely to be admitted to [facility] tomorrow voluntarily. Discussed the diagnosis and treatment plan with the patient. Reviewed labs and vital signs. Reviewed past history. Spoke with his wife. Engaged him in groups and activities on the unit.   DIAGNOSIS PRINCIPLE AND PRIMARY:  AXIS I: Alcohol dependence.   SECONDARY DIAGNOSES:  AXIS I:  1. Anxiety disorder, not otherwise specified.  2. Depression, not otherwise specified.   AXIS II: Deferred.   AXIS III: High blood pressure, congenital deafness with cochlear implant, gastric reflux symptoms.   AXIS IV: Moderate stress from the effects of the alcohol use and the DWIs.       AXIS V: Functioning at time of evaluation: 50.    ____________________________ Audery AmelJohn T. Marleah Beever, MD jtc:bjt D: 07/05/2012 18:01:51 ET T: 07/06/2012 08:55:00 ET JOB#: [id number]  cc: Audery AmelJohn T. Mccrae Speciale, MD, <Dictator> Audery AmelJOHN T Ege Muckey MD ELECTRONICALLY SIGNED 07/06/2012 12:57

## 2014-12-04 NOTE — Discharge Summary (Signed)
PATIENT NAME:  Jonathan [name], BOZZI MR#:  [id number] DATE OF BIRTH:  [birth year]  DATE OF ADMISSION:  07/04/2012 DATE OF DISCHARGE:  07/06/2012  HOSPITAL COURSE: See dictated history and physical for details of admission. This is a 34 year old man with a history of anxiety disorder, depression, and alcohol dependence who presented to the hospital on November 16th to the Emergency Room after relapsing heavily on alcohol, had been drinking a fifth or more of alcohol a day for days at a time. He had expressed some suicidal ideation. He felt like his life was out of control, had recently had some DUIs. Initially he stayed in the Emergency Room for a couple of days because we did not have a bed available on the unit. There was also an attempt made at that time to get him referred [facility] [place]. As of the 18th the patient was still not admitted to [facility] and a bed was available on the unit. I admitted him to the psychiatry ward. He was not showing any acute signs of alcohol withdrawal and did not require significant physical detox treatment. He is still a little bit overwhelmed and feeling anxious and depressed. He gives a history of having been drinking at least a fifth of liquor a day for many days possibly more than a month. He has had two DUI's in the last month. His chief complaint was drinking. He also endorses depression and anxiety, although he says these have been reasonably well controlled until he escalated his alcohol use. The patient was referred to groups and activities on the unit and was appropriate in his interactions. [facility] bed was available on the 20th. The patient was counseled that this would probably be his best resource for helping him to stay sober long-term and was agreeable to transfer there. He was no longer kept under involuntary commitment but was discharged with plans that he be directly transferred to [facility] for admission to [facility]. At no time in the  hospital did he endorse suicidal ideation except for the first day in the Emergency Room. He specifically denied it at the time of discharge and did not show any dangerous or aggressive behavior. He was otherwise maintained on medication that he had previously been on for his anxiety and gastric reflux.   DISCHARGE MEDICATIONS:  1. Crestor 5 mg at bedtime.  2. Paxil 40 mg per day.  3. Protonix 40 mg twice a day. 4. Vistaril 50 mg q.6 hours p.r.n. for anxiety.   LABORATORY RESULTS: The patient initially presented with a CBC that was entirely normal. Chemistry panel showed a sodium slightly low at 135. ALT elevated at 84. AST 45. Total protein 8.7. Alcohol undetectable. TSH normal at 1.5. Urinalysis unremarkable. Drug screen positive for benzodiazepines.   CT of the head was normal as was CT of the cervical spine.   MENTAL STATUS EXAM AT DISCHARGE: Casually dressed, neatly groomed man who looks his stated age. Cooperative with the interview. Good eye contact, normal psychomotor activity. Speech normal rate, tone, and volume. Affect reactive, euthymic, and appropriate. Mood stated as being okay but nervous. Thoughts are lucid with no loosening of associations and no evidence of delusional thinking. Denies auditory or visual hallucinations. Denied suicidal or homicidal ideation. Shows improved insight and judgment. Normal intelligence. Normal speech rate and tone. Normal current short-term and longer term memory.   DISPOSITION: He is discharged voluntarily from the hospital but with a clear plan that he is to  go to [facility] directly for admission.   DIAGNOSIS PRINCIPLE AND PRIMARY:  AXIS I: Alcohol dependence.   SECONDARY DIAGNOSES:  AXIS I:  1. Depression, not otherwise specified.  2. Panic disorder.   AXIS II: Deferred.   AXIS III:  1. Hypertension. 2. Alcohol withdrawal. 3. Gastric reflux symptoms. 4. Recent fall while intoxicated without clear sequela.   AXIS IV: Moderate to severe from  his DUI's and effects of substance abuse.   AXIS V: Functioning at time of discharge 60.  ____________________________ Audery AmelJohn T. Kadeja Granada, MD jtc:drc D: 07/06/2012 12:52:00 ET T: 07/06/2012 17:14:35 ET JOB#: [id number] Audery AmelJOHN T Dequon Schnebly MD ELECTRONICALLY SIGNED 07/07/2012 15:25

## 2015-02-04 ENCOUNTER — Telehealth: Payer: Self-pay | Admitting: Psychiatry

## 2015-02-04 NOTE — Telephone Encounter (Signed)
Paula Compton states that patient needs to be committed.

## 2015-02-05 NOTE — Telephone Encounter (Signed)
Return call. Left a voicemail. Told her that she can go to [employer] and file a petition if she thinks that is appropriate but that I cannot file a petition at this time since I have not seen him recently.

## 2015-02-07 ENCOUNTER — Ambulatory Visit (INDEPENDENT_AMBULATORY_CARE_PROVIDER_SITE_OTHER): Payer: 59 | Admitting: Psychiatry

## 2015-02-07 ENCOUNTER — Encounter: Payer: Self-pay | Admitting: Psychiatry

## 2015-02-07 VITALS — BP 140/80 | HR 104 | Temp 97.8°F | Ht 72.0 in | Wt 214.6 lb

## 2015-02-07 DIAGNOSIS — J309 Allergic rhinitis, unspecified: Secondary | ICD-10-CM | POA: Insufficient documentation

## 2015-02-07 DIAGNOSIS — K219 Gastro-esophageal reflux disease without esophagitis: Secondary | ICD-10-CM | POA: Insufficient documentation

## 2015-02-07 DIAGNOSIS — F332 Major depressive disorder, recurrent severe without psychotic features: Secondary | ICD-10-CM | POA: Diagnosis not present

## 2015-02-07 DIAGNOSIS — R945 Abnormal results of liver function studies: Secondary | ICD-10-CM | POA: Insufficient documentation

## 2015-02-07 DIAGNOSIS — K224 Dyskinesia of esophagus: Secondary | ICD-10-CM | POA: Insufficient documentation

## 2015-02-07 DIAGNOSIS — R7989 Other specified abnormal findings of blood chemistry: Secondary | ICD-10-CM | POA: Insufficient documentation

## 2015-02-07 DIAGNOSIS — F1010 Alcohol abuse, uncomplicated: Secondary | ICD-10-CM | POA: Diagnosis not present

## 2015-02-07 DIAGNOSIS — E782 Mixed hyperlipidemia: Secondary | ICD-10-CM | POA: Insufficient documentation

## 2015-02-07 DIAGNOSIS — F410 Panic disorder [episodic paroxysmal anxiety] without agoraphobia: Secondary | ICD-10-CM

## 2015-02-07 DIAGNOSIS — E785 Hyperlipidemia, unspecified: Secondary | ICD-10-CM | POA: Insufficient documentation

## 2015-02-07 DIAGNOSIS — H9190 Unspecified hearing loss, unspecified ear: Secondary | ICD-10-CM | POA: Insufficient documentation

## 2015-02-07 MED ORDER — DIAZEPAM 10 MG PO TABS: 5.0000 mg | ORAL_TABLET | Freq: Two times a day (BID) | ORAL | Status: DC | PRN

## 2015-02-07 MED ORDER — LITHIUM CARBONATE ER 300 MG PO TBCR: 600.0000 mg | EXTENDED_RELEASE_TABLET | Freq: Every day | ORAL | Status: DC

## 2015-02-07 MED ORDER — PAROXETINE HCL 20 MG PO TABS: 60.0000 mg | ORAL_TABLET | Freq: Every day | ORAL | Status: DC

## 2015-02-07 NOTE — Progress Notes (Signed)
BH MD/PA/NP OP Progress Note  02/07/2015 2:12 PM Jonathan [name]  MRN:  [id number]  Subjective:  "I've been feeling very bad" follow-up for this patient who has not been in to my office in quite a while. Chief Complaint:  "I've been very depressed" Visit Diagnosis: Major depression severe recurrent    ICD-9-CM ICD-10-CM   1. Severe recurrent major depression without psychotic features 296.33 F33.2 Comprehensive metabolic panel  2. Major depressive disorder, recurrent, severe without psychotic features 296.33 F33.2   3. Alcohol abuse 305.00 F10.10   4. Panic disorder 300.01 F41.0     Past Medical History:  Past Medical History  Diagnosis Date  . Liver disease   . Gastric reflux   . Panic disorder     Past Surgical History  Procedure Laterality Date  . Colon surgery    . Ear surgey     Family History:  Family History  Problem Relation Age of Onset  . Diabetes Mother   . Depression Mother   . Anxiety disorder Mother   . COPD Mother   . Heart attack Father   . Diabetes Father    Social History:  History   Social History  . Marital Status: Married    Spouse Name: N/A  . Number of Children: N/A  . Years of Education: N/A   Social History Main Topics  . Smoking status: Current Every Day Smoker -- 1.00 packs/day    Types: Cigarettes    Start date: 02/07/2000  . Smokeless tobacco: Current User    Types: Snuff, Chew  . Alcohol Use: 8.4 oz/week    0 Standard drinks or equivalent, 12 Cans of beer, 2 Shots of liquor per week     Comment: trying to quit last about 2 weeks ago  . Drug Use: No  . Sexual Activity: Yes    Birth Control/ Protection: None   Other Topics Concern  . None   Social History Narrative  . None   Additional History: This is a 34 year old man who I've worked with several times previously both in and outside of the hospital but have not seen in quite a while. He presents today stating that he's been functioning very poorly. He and his wife broke up  over a year ago and he says he thinks he's been depressed ever since then. His mood will be up and down but most recently has stayed down and depressed most of the time. He sleeps poorly at night. Functioning is poor. He got fired from his job a few months ago and has started a new job but hasn't been able to go to that in a week. He says that he's had suicidal thoughts but has not had a specific plan. He does not actually want to die. He states that right now he does not feel like he is actually planning on doing anything and wants to try to get better instead. He is not having any current psychotic symptoms. He tells me that he has stopped drinking. In the past he had a heavy problem with drinking liquor. He says that several months ago he cut down to beer and in the last 2 weeks has had no alcohol at all. He is not abusing any other drugs. He has continued to take Paxil 40 mg a day that I had prescribed previously and feels it is of no help now. He had a few Valium left over from prior treatment and has taken those recently and finish them. His  primary care doctor tried starting him on bupropion a couple months ago and it made him so agitated that he couldn't stand it and he discontinued it.  Major stresses include diminished finances, diminished access to his child now that he is separated,   patient presents as neatly groomed and appears to be very earnest and wanting to engage in treatment and get help. Eye contact is good. Speech is quiet. He is always very quiet and reserved and he a signs of delusional thinking.dysphoric. Not tearful.has had passive suicidal thoughts without intent or plan. No homicidal ideation. ctually talks to me more this time than he ever has before but that certainly not because he is pressured. He appears anxious and   Assessment: Jonathan [name] is concern ed about the possibility that he may have bipolar disorder. My original diagnosis of him had been panic disorder  Which had  appeared to respond well to medication. Since then it has been more clear that he has a problem with his mood but I have never seen him or heardher report of him being manic.to me he appears to be extremely depressed. I suggested the possibility of inpatient treatment but acknowledged that we currently have no beds available on our ward. We discussed the availability of other hospitals in the area that he could go to if he felt unsafe and needed hospitalization. For now I would like to increase his Paxil to 60 mg a day. Add lithium carbonate 600 mg at night for treatment of depression, possible cover of bipolar depression and suppression of suicidal ideation. I agreed to give him 30 of the 10 mg diazepam's but would like him to take only a half of a pill at a time. This should be enough to cover the worst of his anxiety. It was explained to him that this is a potentially addictive medicine. Additionally I want him to get a comprehensive metabolic panel drawn. Order placed to the lab. He understands he needs to go do this.  I am not going to commit him. Despite his suicidal thoughts he is reporting a desire to get help, he does not appear to be psychotic, he is no longer drinking and he says his family are staying with him frequently. Probably all the time. He understands that he can call me back but I made it clear to him that while I would do my best to return a call he should go to an emergency room if he is truly feeling acutely suicidal. I will try to see him back next week lots of encouragement and supportive and educational counseling. Patient agrees to the plan.  I will also note that he was requesting that I prescribe Antabuse for him. I am not comfortable with that given all the other things that are going on and the fact that he is not engaged in a substance abuse treatment program. We will table that discussion for later.   Musculoskeletal: Strength & Muscle Tone: within normal limits Gait &  Station: normal Patient leans: N/A  Psychiatric Specialty Exam: HPI  ROS  Blood pressure 140/80, pulse 104, temperature 97.8 F (36.6 C), temperature source Tympanic, height 6' (1.829 m), weight 214 lb 9.6 oz (97.342 kg), SpO2 96 %.Body mass index is 29.1 kg/(m^2).  General Appearance: Casual  Eye Contact:  Good  Speech:  Clear and Coherent  Volume:  Decreased  Mood:  Anxious and Depressed  Affect:  Depressed  Thought Process:  Coherent  Orientation:  Full (Time, Place,  and Person)  Thought Content:  Negative  Suicidal Thoughts:  Yes.  without intent/plan  Homicidal Thoughts:  No  Memory:  Immediate;   Good Recent;   Good Remote;   Good  Judgement:  Intact  Insight:  Present  Psychomotor Activity:  Normal  Concentration:  Fair  Recall:  Fair  Fund of Knowledge: Good  Language: Good  Akathisia:  No  Handed:  Right  AIMS (if indicated):     Assets:  Communication Skills Desire for Improvement Housing Intimacy Physical Health Resilience Social Support  ADL's:  Intact  Cognition: WNL  Sleep:  impaireed recently   Is the patient at risk to self?  No. Has the patient been a risk to self in the past 6 months?  No. Has the patient been a risk to self within the distant past?  Yes.   Is the patient a risk to others?  No. Has the patient been a risk to others in the past 6 months?  No. Has the patient been a risk to others within the distant past?  No.  Current Medications: Current Outpatient Prescriptions  Medication Sig Dispense Refill  . cetirizine (ZYRTEC) 10 MG tablet Take by mouth.    . diazepam (VALIUM) 10 MG tablet Take 0.5 tablets (5 mg total) by mouth every 12 (twelve) hours as needed for anxiety. 30 tablet 0  . PARoxetine (PAXIL) 20 MG tablet Take 3 tablets (60 mg total) by mouth daily. 90 tablet 0  . rosuvastatin (CRESTOR) 10 MG tablet Take by mouth.    . lithium carbonate (LITHOBID) 300 MG CR tablet Take 2 tablets (600 mg total) by mouth at bedtime. 60  tablet 0   No current facility-administered medications for this visit.    Medical Decision Making:  Review of Psycho-Social Stressors (1), Established Problem, Worsening (2), Review of Last Therapy Session (1), Review of Medication Regimen & Side Effects (2) and Review of New Medication or Change in Dosage (2)  Treatment Plan Summary:Medication management and Plan Increase Paxil to 60 mg per day. Initiate lithium 600 mg at night. Continue diazepam but at only 5 mg twice a day as needed. Follow-up with me next week. Go to emergency room immediately if suicidal ideation worsening. Call me if needed but do not rely on that to replace an emergency room. Go to get a comprehensive metabolic panel drawn   Mordecai Rasmussen 02/07/2015, 2:12 PM

## 2015-02-12 ENCOUNTER — Encounter: Payer: Self-pay | Admitting: Psychiatry

## 2015-02-12 NOTE — Progress Notes (Signed)
I received a telephone call today from this patient's mother. She requested a telephone call back. Without the patient's consent I am not comfortable doing this. Instead I called the patient at the listed phone number for him and left a message for him requesting that he call me back.

## 2015-02-13 ENCOUNTER — Telehealth: Payer: Self-pay | Admitting: Psychiatry

## 2015-02-14 NOTE — Telephone Encounter (Signed)
I do not have any consent from the patient to speak to his mother. Yesterday I called the patient and left a message with him informing him that his mother had called and asking him to call me back. I have not gotten a message back from him yet. Under the circumstances I do not think it would be appropriate for me to call his mother back without speaking to him. I am sorry for her frustration. If we want to let her know of the basic information that one can go to the magistrate and attempt to reduce wear out a commitment petition if one wants to have someone brought to the hospital we can do that please

## 2015-02-15 ENCOUNTER — Encounter (HOSPITAL_COMMUNITY): Payer: Self-pay | Admitting: Neurology

## 2015-02-15 ENCOUNTER — Ambulatory Visit: Payer: 59 | Admitting: Psychiatry

## 2015-02-15 ENCOUNTER — Emergency Department (HOSPITAL_COMMUNITY)
Admission: EM | Admit: 2015-02-15 | Discharge: 2015-02-16 | Disposition: A | Discharge status: Other Acute Inpatient Hospital | Payer: 59 | Attending: Emergency Medicine | Admitting: Emergency Medicine

## 2015-02-15 DIAGNOSIS — K219 Gastro-esophageal reflux disease without esophagitis: Secondary | ICD-10-CM | POA: Insufficient documentation

## 2015-02-15 DIAGNOSIS — Z720 Tobacco use: Secondary | ICD-10-CM | POA: Insufficient documentation

## 2015-02-15 DIAGNOSIS — R21 Rash and other nonspecific skin eruption: Secondary | ICD-10-CM | POA: Insufficient documentation

## 2015-02-15 DIAGNOSIS — F329 Major depressive disorder, single episode, unspecified: Secondary | ICD-10-CM | POA: Insufficient documentation

## 2015-02-15 DIAGNOSIS — F410 Panic disorder [episodic paroxysmal anxiety] without agoraphobia: Secondary | ICD-10-CM | POA: Insufficient documentation

## 2015-02-15 DIAGNOSIS — R45851 Suicidal ideations: Secondary | ICD-10-CM

## 2015-02-15 DIAGNOSIS — F1310 Sedative, hypnotic or anxiolytic abuse, uncomplicated: Secondary | ICD-10-CM | POA: Insufficient documentation

## 2015-02-15 DIAGNOSIS — Z79899 Other long term (current) drug therapy: Secondary | ICD-10-CM | POA: Insufficient documentation

## 2015-02-15 HISTORY — DX: Depression, unspecified: F32.A

## 2015-02-15 HISTORY — DX: Anxiety disorder, unspecified: F41.9

## 2015-02-15 HISTORY — DX: Major depressive disorder, single episode, unspecified: F32.9

## 2015-02-15 LAB — CBC
HCT: 49.7 % (ref 39.0–52.0)
Hemoglobin: 16.9 g/dL (ref 13.0–17.0)
MCH: 30.7 pg (ref 26.0–34.0)
MCHC: 34 g/dL (ref 30.0–36.0)
MCV: 90.2 fL (ref 78.0–100.0)
Platelets: 353 10*3/uL (ref 150–400)
RBC: 5.51 MIL/uL (ref 4.22–5.81)
RDW: 12.3 % (ref 11.5–15.5)
WBC: 11.3 10*3/uL — ABNORMAL HIGH (ref 4.0–10.5)

## 2015-02-15 LAB — RAPID URINE DRUG SCREEN, HOSP PERFORMED
Amphetamines: NOT DETECTED
BARBITURATES: NOT DETECTED
BENZODIAZEPINES: POSITIVE — AB
Cocaine: NOT DETECTED
OPIATES: NOT DETECTED
TETRAHYDROCANNABINOL: NOT DETECTED

## 2015-02-15 LAB — COMPREHENSIVE METABOLIC PANEL
ALT: 37 U/L (ref 17–63)
ANION GAP: 10 (ref 5–15)
AST: 24 U/L (ref 15–41)
Albumin: 4.4 g/dL (ref 3.5–5.0)
Alkaline Phosphatase: 67 U/L (ref 38–126)
BUN: 8 mg/dL (ref 6–20)
CO2: 28 mmol/L (ref 22–32)
Calcium: 10 mg/dL (ref 8.9–10.3)
Chloride: 99 mmol/L — ABNORMAL LOW (ref 101–111)
Creatinine, Ser: 1.21 mg/dL (ref 0.61–1.24)
GFR calc Af Amer: >60 mL/min (ref >60)
GFR calc non Af Amer: >60 mL/min (ref >60)
Glucose, Bld: 116 mg/dL — ABNORMAL HIGH (ref 65–99)
Potassium: 4.4 mmol/L (ref 3.5–5.1)
Sodium: 137 mmol/L (ref 135–145)
Total Bilirubin: 0.6 mg/dL (ref 0.3–1.2)
Total Protein: 7.4 g/dL (ref 6.5–8.1)

## 2015-02-15 LAB — SALICYLATE LEVEL

## 2015-02-15 LAB — ACETAMINOPHEN LEVEL: Acetaminophen (Tylenol), Serum: <10 ug/mL — ABNORMAL LOW (ref 10–30)

## 2015-02-15 LAB — ETHANOL

## 2015-02-15 MED ORDER — LITHIUM CARBONATE ER 300 MG PO TBCR
600.0000 mg | EXTENDED_RELEASE_TABLET | Freq: Every day | ORAL | Status: DC
  Administered 2015-02-15: 600 mg via ORAL
  Filled 2015-02-15: qty 2

## 2015-02-15 MED ORDER — DIAZEPAM 5 MG PO TABS: 5.0000 mg | ORAL_TABLET | Freq: Three times a day (TID) | ORAL | Status: DC | PRN

## 2015-02-15 MED ORDER — LORAZEPAM 1 MG PO TABS: 1.0000 mg | ORAL_TABLET | Freq: Three times a day (TID) | ORAL | Status: DC | PRN

## 2015-02-15 MED ORDER — PANTOPRAZOLE SODIUM 40 MG PO TBEC
40.0000 mg | DELAYED_RELEASE_TABLET | Freq: Every day | ORAL | Status: DC
  Administered 2015-02-15: 40 mg via ORAL
  Filled 2015-02-15: qty 1

## 2015-02-15 MED ORDER — ONDANSETRON HCL 4 MG PO TABS: 4.0000 mg | ORAL_TABLET | Freq: Three times a day (TID) | ORAL | Status: DC | PRN

## 2015-02-15 MED ORDER — ROSUVASTATIN CALCIUM 10 MG PO TABS
10.0000 mg | ORAL_TABLET | Freq: Every day | ORAL | Status: DC
  Administered 2015-02-15: 10 mg via ORAL
  Filled 2015-02-15: qty 1

## 2015-02-15 MED ORDER — PAROXETINE HCL 30 MG PO TABS
60.0000 mg | ORAL_TABLET | Freq: Every day | ORAL | Status: DC
  Administered 2015-02-15: 60 mg via ORAL
  Filled 2015-02-15: qty 2

## 2015-02-15 MED ORDER — NICOTINE 21 MG/24HR TD PT24: 21.0000 mg | MEDICATED_PATCH | Freq: Every day | TRANSDERMAL | Status: DC

## 2015-02-15 NOTE — ED Notes (Signed)
Pt reports feeling depressed and having SI. Recently lost job, going through a divorce and can't see his child. Plan to shoot himself but doesn't have a gun. Pt has significant other with him in triage. Pt appears anxious and withdrawn.

## 2015-02-15 NOTE — ED Provider Notes (Signed)
CSN: [id number]     Arrival date & time 02/15/15  1506 History   First MD Initiated Contact with Patient 02/15/15 1528     Chief Complaint  Patient presents with  . Suicidal   Patient is a 34 y.o. male presenting with mental health disorder. The history is provided by the patient.  Mental Health Problem Presenting symptoms: depression and suicidal thoughts   Presenting symptoms: no aggressive behavior, no agitation, no bizarre behavior, no delusions, no disorganized speech, no disorganized thought process, no hallucinations, no homicidal ideas, no paranoid behavior, no self mutilation, no suicidal threats and no suicide attempt   Patient accompanied by:  Partner and family member Degree of incapacity (severity):  Moderate Onset quality:  Gradual Duration:  3 weeks Timing:  Constant Progression:  Worsening Chronicity:  New Context: stressful life event   Context: not alcohol use, not drug abuse, not noncompliant and not recent medication change   Treatment compliance:  All of the time Relieved by:  Nothing Worsened by:  Nothing tried Ineffective treatments:  None tried Associated symptoms: feelings of worthlessness   Associated symptoms: no abdominal pain, no anhedonia, no anxiety, no appetite change, no chest pain, no decreased need for sleep, not distractible, no euphoric mood, no fatigue, no headaches, no hypersomnia, no hyperventilation, no insomnia, no irritability, no poor judgment, no psychomotor retardation, no school problems and no weight change   Risk factors: hx of mental illness     Past Medical History  Diagnosis Date  . Liver disease   . Gastric reflux   . Panic disorder   . Anxiety   . Depression    Past Surgical History  Procedure Laterality Date  . Colon surgery    . Ear surgey     Family History  Problem Relation Age of Onset  . Diabetes Mother   . Depression Mother   . Anxiety disorder Mother   . COPD Mother   . Heart attack Father   . Diabetes Father     History  Substance Use Topics  . Smoking status: Current Every Day Smoker -- 1.00 packs/day    Types: Cigarettes    Start date: 02/07/2000  . Smokeless tobacco: Current User    Types: Snuff, Chew  . Alcohol Use: 8.4 oz/week    12 Cans of beer, 2 Shots of liquor, 0 Standard drinks or equivalent per week     Comment: trying to quit last about 2 weeks ago    Review of Systems  Constitutional: Negative for appetite change, irritability and fatigue.  Cardiovascular: Negative for chest pain.  Gastrointestinal: Negative for abdominal pain.  Musculoskeletal: Negative for myalgias, back pain, joint swelling, arthralgias, neck pain and neck stiffness.  Skin: Positive for rash.  Neurological: Negative for weakness, numbness and headaches.  Psychiatric/Behavioral: Positive for suicidal ideas. Negative for homicidal ideas, hallucinations, self-injury, paranoia and agitation. The patient is not nervous/anxious and does not have insomnia.    Allergies  Review of patient's allergies indicates no known allergies.  Home Medications   Prior to Admission medications   Medication Sig Start Date End Date Taking? Authorizing Provider  dexlansoprazole (DEXILANT) 60 MG capsule Take 60 mg by mouth daily.   Yes Historical Provider, MD  diazepam (VALIUM) 10 MG tablet Take 0.5 tablets (5 mg total) by mouth every 12 (twelve) hours as needed for anxiety. 02/07/15  Yes Audery Amel, MD  diphenhydrAMINE (SOMINEX) 25 MG tablet Take 25 mg by mouth at bedtime as needed for itching or  sleep.   Yes Historical Provider, MD  Flaxseed, Linseed, (FLAX SEED OIL PO) Take 1 capsule by mouth daily.   Yes Historical Provider, MD  lithium carbonate (LITHOBID) 300 MG CR tablet Take 2 tablets (600 mg total) by mouth at bedtime. 02/07/15  Yes Audery Amel, MD  PARoxetine (PAXIL) 20 MG tablet Take 3 tablets (60 mg total) by mouth daily. 02/07/15  Yes Audery Amel, MD  rosuvastatin (CRESTOR) 10 MG tablet Take 10 mg by mouth  daily.  01/31/15  Yes Historical Provider, MD   BP 123/62 mmHg  Pulse 87  Temp(Src) 98.2 F (36.8 C) (Oral)  Resp 16  Ht 6' (1.829 m)  Wt 213 lb (96.616 kg)  BMI 28.88 kg/m2  SpO2 97% Physical Exam  Constitutional: He is oriented to person, place, and time. He appears well-developed and well-nourished. No distress.  HENT:  Head: Normocephalic and atraumatic.  Mouth/Throat: No oropharyngeal exudate.  Eyes: Conjunctivae and EOM are normal. Pupils are equal, round, and reactive to light. Right eye exhibits no discharge.  Neck: Normal range of motion. Neck supple. No tracheal deviation present.  Cardiovascular: Normal rate, regular rhythm and normal heart sounds.   No murmur heard. Pulmonary/Chest: Effort normal and breath sounds normal. No respiratory distress.  Abdominal: Soft. Bowel sounds are normal. He exhibits no distension. There is no tenderness.  Musculoskeletal: Normal range of motion. He exhibits no edema or tenderness.  Neurological: He is alert and oriented to person, place, and time. He has normal strength and normal reflexes. No cranial nerve deficit or sensory deficit. He displays a negative Romberg sign. GCS eye subscore is 4. GCS verbal subscore is 5. GCS motor subscore is 6.  Skin: Rash noted. There is erythema.     Psychiatric: Judgment and thought content normal. His speech is delayed. He is slowed and withdrawn. He is not agitated, not aggressive and not actively hallucinating. Cognition and memory are normal. He exhibits a depressed mood. He is attentive.    ED Course  Procedures (including critical care time) Labs Review Labs Reviewed  ACETAMINOPHEN LEVEL - Abnormal; Notable for the following:    Acetaminophen (Tylenol), Serum <10 (*)    All other components within normal limits  CBC - Abnormal; Notable for the following:    WBC 11.3 (*)    All other components within normal limits  COMPREHENSIVE METABOLIC PANEL - Abnormal; Notable for the following:     Chloride 99 (*)    Glucose, Bld 116 (*)    All other components within normal limits  URINE RAPID DRUG SCREEN, HOSP PERFORMED - Abnormal; Notable for the following:    Benzodiazepines POSITIVE (*)    All other components within normal limits  ETHANOL  SALICYLATE LEVEL  ROCKY MTN SPOTTED FVR ABS PNL(IGG+IGM)    Imaging Review No results found.   EKG Interpretation None      MDM   Final diagnoses:  None    Pt si a 34 yo male with known Etoh abuse presenting for SI today.  Pt wrote a note three weeks ago stating he was going to shoot himself but family helped stop him at the time. No gun in the home but reports he knows where to get one now.  Quit drinking three weeks ago and denies any other drug abuse.  Reports today for increasing feeling of "bad thoughts" and wishing to harm himself.  No current plan however.  Denies any ingestion today.  No HI or AVH.  Endorses taking  home medications for anxiety and depression.  Pt also reports rash on his left leg for roughly one month with no known tick exposure, fevers, chills, headaches, or weakness.   On evaluation pt HDS in NAD.  Pt evaluated and found to have no acute medical needs except target rash on left thigh.  Denies any hx of tick bites but has found multiple on himself in the past.  Rash consistent with erythema migrans.  Lyme titers and RMSF titers sent.  Empiric doxy started in the ED.  Otherwise cleared medically.  Evaluated by psych who plan to find inpatient tx in the morning.    Pt stable for psychiatric treatment but will require follow up on lyme/RMSF titers and continued doxy.  Will be admitted to psych once bed available.  Necessary home meds reordered and pt in stable condition.  Staffed with Dr. Jeraldine LootsLockwood.      Tery SanfilippoMatthew Raisha Brabender, MD 02/15/15 [phone]  Gerhard Munchobert Lockwood, MD 02/15/15 [phone]

## 2015-02-15 NOTE — ED Notes (Signed)
Pt also reports possible insect bite to L leg. Large red circle noted to L lower leg, pt states severe itching.

## 2015-02-15 NOTE — Progress Notes (Addendum)
Patient was referred/followed up at: [facility] - per intake, fax it. [facility] - per [facility], has beds but won't take anyone this weekend. [facility] - per intake, fax it. [facility] - per intake, fax referral. [facility] - per [facility]in[facility] Plan[facility], beds open, will review referral. [facility] - left voicemail. [facility] - Per Onalee Huaavid, fax it. [facility] - per intake, fax referral. [facility] - left voicemail. [facility] - per intake, fax referral for review. [facility] - per Arley PhenixAisha, adult male beds open. [facility] - left voicemail. [facility] - per [facility], fax it.  [facility] - per Phill, 1 bed and holding it for "our ED".  CSW will continue to seek placement.  Melbourne Abtsatia Gloriana Piltz, LCSWA Disposition staff 02/15/2015 11:19 PM

## 2015-02-15 NOTE — BH Assessment (Signed)
Tele Assessment Note   Jonathan Jonathan [name] is an 34 y.o. male. Patient was brought into the ED by his parents and girlfriend because of symptoms of depression.  Patient reports suicidal ideations with no plan.  Patient reports having negative thought daily since January.  Patient reports in the last years losing his job, marriage ending, and unable to to see his son.  Patient denies current symptoms of homicidal ideations, hallucinations, and other self injurious behaviors.  Patient reports recently being more paranoid and unable to be alone.  When he is left alone he becomes anxious and tearful.  Patient reports only sleeping 3-5 hours Jonathan [name] night, low motivations, only eating 1 meal daily, normal hygiene practices, and lost more than 20lbs since February.    Patient reports Jonathan [name] history of alcohol use but none in the last 2 weeks.  Patient reports in the patient drinking between 2 beers and Jonathan [name] half gallon of liquor daily.  Patient reports he started drinking at the age of 89 and most recently decided to stop.  Patient reports currently receiving treatment from Dr. Toni Amendlapacs and most recently having Jonathan [name] medication change.   Pending disposition.      Axis I: Major Depression, Recurrent severe and Alcohol use, mild Axis II: Deferred Axis III:  Past Medical History  Diagnosis Date  . Liver disease   . Gastric reflux   . Panic disorder   . Anxiety   . Depression    Axis IV: other psychosocial or environmental problems, problems related to social environment, problems with access to health care services and problems with primary support group Axis V: 41-50 serious symptoms  Past Medical History:  Past Medical History  Diagnosis Date  . Liver disease   . Gastric reflux   . Panic disorder   . Anxiety   . Depression     Past Surgical History  Procedure Laterality Date  . Colon surgery    . Ear surgey      Family History:  Family History  Problem Relation Age of Onset  . Diabetes Mother   .  Depression Mother   . Anxiety disorder Mother   . COPD Mother   . Heart attack Father   . Diabetes Father     Social History:  reports that he has been smoking Cigarettes.  He started smoking about 15 years ago. He has been smoking about 1.00 pack per day. His smokeless tobacco use includes Snuff and Chew. He reports that he drinks about 8.4 oz of alcohol per week. He reports that he does not use illicit drugs.  Additional Social History:  Alcohol / Drug Use Pain Medications: See MARs Prescriptions: See MARs History of alcohol / drug use?: Yes Longest period of sobriety (when/how long): 2 weeks Negative Consequences of Use: Personal relationships Withdrawal Symptoms: Other (Comment) (none reported at this time) Substance #1 Name of Substance 1: alcohol 1 - Age of First Use: 19 1 - Amount (size/oz): 2 beers to half gallon 1 - Frequency: daily 1 - Duration: on/off since 19 1 - Last Use / Amount: 6 beers 2 weeks ago  CIWA: CIWA-Ar BP: 151/94 mmHg Pulse Rate: 108 Nausea and Vomiting: no nausea and no vomiting Tactile Disturbances: none Tremor: no tremor Auditory Disturbances: not present Paroxysmal Sweats: no sweat visible Visual Disturbances: not present Anxiety: mildly anxious Headache, Fullness in Head: none present Agitation: somewhat more than normal activity Orientation and Clouding of Sensorium: oriented and can do serial additions CIWA-Ar Total: 2 COWS:  PATIENT STRENGTHS: (choose at least two) Average or above average intelligence Capable of independent living Communication skills Supportive family/friends  Allergies: No Known Allergies  Home Medications:  (Not in Jonathan [name] hospital admission)  OB/GYN Status:  No LMP for male patient.  General Assessment Data Location of Assessment: [facility] TTS Assessment: In system Is this Jonathan [name] Tele or Face-to-Face Assessment?: Tele Assessment Is this an Initial Assessment or Jonathan [name] Re-assessment for this encounter?:  Initial Assessment Marital status: Divorced Is patient pregnant?: No Pregnancy Status: No Living Arrangements: Spouse/significant other Can pt return to current living arrangement?: Yes Admission Status: Voluntary Is patient capable of signing voluntary admission?: Yes Referral Source: Self/Family/Friend  Medical Screening Exam [facility] Walk-in ONLY) Medical Exam completed: Yes  Crisis Care Plan Living Arrangements: Spouse/significant other Name of Psychiatrist: Dr. Toni Amend Name of Therapist: unk  Education Status Is patient currently in school?: No  Risk to self with the past 6 months Suicidal Ideation: Yes-Currently Present Has patient been Jonathan [name] risk to self within the past 6 months prior to admission? : Yes Suicidal Intent: No-Not Currently/Within Last 6 Months Has patient had any suicidal intent within the past 6 months prior to admission? : Yes Is patient at risk for suicide?: Yes Suicidal Plan?: No-Not Currently/Within Last 6 Months Has patient had any suicidal plan within the past 6 months prior to admission? : No Access to Means: No What has been your use of drugs/alcohol within the last 12 months?: alcohol Previous Attempts/Gestures: No How many times?: 0 Intentional Self Injurious Behavior: None Family Suicide History: No Recent stressful life event(s): Loss (Comment), Divorce, Job Loss, Other (Comment) Persecutory voices/beliefs?: No Depression: Yes Depression Symptoms: Despondent, Insomnia, Fatigue, Guilt, Loss of interest in usual pleasures, Feeling worthless/self pity, Feeling angry/irritable (hopelessness) Substance abuse history and/or treatment for substance abuse?: Yes  Risk to Others within the past 6 months Homicidal Ideation: No-Not Currently/Within Last 6 Months Does patient have any lifetime risk of violence toward others beyond the six months prior to admission? : No Thoughts of Harm to Others: No Current Homicidal Intent: No-Not Currently/Within Last 6  Months Current Homicidal Plan: No-Not Currently/Within Last 6 Months Access to Homicidal Means: No History of harm to others?: No Assessment of Violence: None Noted Does patient have access to weapons?: No Criminal Charges Pending?: No Does patient have Jonathan [name] court date: No Is patient on probation?: No  Psychosis Hallucinations: None noted Delusions: None noted  Mental Status Report Appearance/Hygiene: In hospital gown Eye Contact: Fair Motor Activity: Freedom of movement Speech: Slow Level of Consciousness: Drowsy Mood: Depressed Affect: Depressed Anxiety Level: Minimal Thought Processes: Relevant Judgement: Unimpaired Orientation: Person, Place, Time, Situation Obsessive Compulsive Thoughts/Behaviors: None  Cognitive Functioning Concentration: Fair Memory: Recent Intact, Remote Intact IQ: Average Insight: Fair Impulse Control: Fair Appetite: Poor Weight Loss: 20 Weight Gain: 0 Sleep: Decreased Total Hours of Sleep: 4 Vegetative Symptoms: None  ADLScreening [facility] Assessment Services) Patient's cognitive ability adequate to safely complete daily activities?: Yes Patient able to express need for assistance with ADLs?: Yes Independently performs ADLs?: Yes (appropriate for developmental age)  Prior Inpatient Therapy Prior Inpatient Therapy: Yes Prior Therapy Dates: 2013 Prior Therapy Facilty/Provider(s): [facility] Reason for Treatment: SA  Prior Outpatient Therapy Prior Outpatient Therapy: Yes Prior Therapy Dates: current Prior Therapy Facilty/Provider(s): Dr. Toni Amend Reason for Treatment: Depression Does patient have an ACCT team?: No Does patient have Intensive In-House Services?  : No Does patient have [facility] services? : No Does patient have P4CC services?: No  ADL Screening (  condition at time of admission) Patient's cognitive ability adequate to safely complete daily activities?: Yes Patient able to express need for assistance with ADLs?: Yes Independently  performs ADLs?: Yes (appropriate for developmental age)       Abuse/Neglect Assessment (Assessment to be complete while patient is alone) Physical Abuse: Denies Verbal Abuse: Denies Sexual Abuse: Denies Exploitation of patient/patient's resources: Denies Self-Neglect: Denies Values / Beliefs Cultural Requests During Hospitalization: None Spiritual Requests During Hospitalization: None Consults Spiritual Care Consult Needed: No Social Work Consult Needed: No Merchant navy officer (For Healthcare) Does patient have an advance directive?: No Would patient like information on creating an advanced directive?: No - patient declined information    Additional Information 1:1 In Past 12 Months?: No CIRT Risk: No Elopement Risk: No Does patient have medical clearance?: Yes     Disposition:  Disposition Initial Assessment Completed for this Encounter: Yes Disposition of Patient: Other dispositions (pending)  Jonathan Jonathan [name] 02/15/2015 5:31 PM

## 2015-02-15 NOTE — Progress Notes (Signed)
TTS called to arrange the assessment but the RN is busy.  TTS will call back  Jonathan [name], MSW, LCSW, [place] Clinical Social Worker [phone]

## 2015-02-15 NOTE — ED Notes (Signed)
Pt changing into scrubs. Security called to wand pt/ 

## 2015-02-15 NOTE — ED Notes (Signed)
TTS at bedside. 

## 2015-02-15 NOTE — Progress Notes (Signed)
CSW consulted with Dr. Dub MikesLugo it is recommended to refer for inpatient placement.  [facility] for [facility] but none at this time.      Maryelizabeth Rowanressa Avir Deruiter, MSW, LCSW, LCAS Clinical Social Worker [phone]

## 2015-02-15 NOTE — ED Notes (Signed)
Report given to Pearl RiverErin, Charity fundraiserN. Pt moved to C22.

## 2015-02-15 NOTE — ED Notes (Signed)
Pt requesting nicotine patch.

## 2015-02-15 NOTE — ED Notes (Signed)
  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 Pt has charger and would like tto keep the charger with him while he is in the room. Informed pt that we will speak with the physician as well as the nurse about keeping the charger with him in his room.

## 2015-02-15 NOTE — Progress Notes (Signed)
CSW attempted to contact the the patient's parent and girlfriend but no answer for either.      Maryelizabeth Rowanressa Dontaye Hur, MSW, LCSW, LCAS Clinical Social Worker [phone]

## 2015-02-15 NOTE — ED Notes (Signed)
STAFFING MADE AWARE NEED FOR SITTER.

## 2015-02-16 ENCOUNTER — Encounter (HOSPITAL_COMMUNITY): Payer: Self-pay | Admitting: *Deleted

## 2015-02-16 ENCOUNTER — Inpatient Hospital Stay (HOSPITAL_COMMUNITY)
Admission: AD | Admit: 2015-02-16 | Discharge: 2015-02-22 | DRG: 885 | Disposition: A | Discharge status: Home / Self Care | Payer: No Typology Code available for payment source | Source: Intra-hospital | Attending: Psychiatry | Admitting: Psychiatry

## 2015-02-16 DIAGNOSIS — Z818 Family history of other mental and behavioral disorders: Secondary | ICD-10-CM

## 2015-02-16 DIAGNOSIS — G4700 Insomnia, unspecified: Secondary | ICD-10-CM | POA: Diagnosis present

## 2015-02-16 DIAGNOSIS — F1020 Alcohol dependence, uncomplicated: Secondary | ICD-10-CM

## 2015-02-16 DIAGNOSIS — R7989 Other specified abnormal findings of blood chemistry: Secondary | ICD-10-CM | POA: Diagnosis present

## 2015-02-16 DIAGNOSIS — R45851 Suicidal ideations: Secondary | ICD-10-CM | POA: Diagnosis present

## 2015-02-16 DIAGNOSIS — Z833 Family history of diabetes mellitus: Secondary | ICD-10-CM

## 2015-02-16 DIAGNOSIS — F410 Panic disorder [episodic paroxysmal anxiety] without agoraphobia: Secondary | ICD-10-CM | POA: Diagnosis present

## 2015-02-16 DIAGNOSIS — H9190 Unspecified hearing loss, unspecified ear: Secondary | ICD-10-CM | POA: Diagnosis present

## 2015-02-16 DIAGNOSIS — E785 Hyperlipidemia, unspecified: Secondary | ICD-10-CM | POA: Diagnosis present

## 2015-02-16 DIAGNOSIS — Z825 Family history of asthma and other chronic lower respiratory diseases: Secondary | ICD-10-CM

## 2015-02-16 DIAGNOSIS — F329 Major depressive disorder, single episode, unspecified: Secondary | ICD-10-CM | POA: Diagnosis present

## 2015-02-16 DIAGNOSIS — F1021 Alcohol dependence, in remission: Secondary | ICD-10-CM | POA: Diagnosis not present

## 2015-02-16 DIAGNOSIS — Z8249 Family history of ischemic heart disease and other diseases of the circulatory system: Secondary | ICD-10-CM

## 2015-02-16 DIAGNOSIS — Z79899 Other long term (current) drug therapy: Secondary | ICD-10-CM | POA: Diagnosis not present

## 2015-02-16 DIAGNOSIS — F332 Major depressive disorder, recurrent severe without psychotic features: Principal | ICD-10-CM | POA: Diagnosis present

## 2015-02-16 DIAGNOSIS — K219 Gastro-esophageal reflux disease without esophagitis: Secondary | ICD-10-CM | POA: Diagnosis present

## 2015-02-16 DIAGNOSIS — F17210 Nicotine dependence, cigarettes, uncomplicated: Secondary | ICD-10-CM | POA: Diagnosis present

## 2015-02-16 DIAGNOSIS — J309 Allergic rhinitis, unspecified: Secondary | ICD-10-CM | POA: Diagnosis present

## 2015-02-16 MED ORDER — DIPHENHYDRAMINE HCL 25 MG PO CAPS
25.0000 mg | ORAL_CAPSULE | Freq: Every evening | ORAL | Status: DC | PRN
  Administered 2015-02-16 – 2015-02-20 (×5): 25 mg via ORAL
  Filled 2015-02-16 (×5): qty 1

## 2015-02-16 MED ORDER — TRAZODONE HCL 50 MG PO TABS
50.0000 mg | ORAL_TABLET | Freq: Every evening | ORAL | Status: DC | PRN
  Administered 2015-02-16 – 2015-02-18 (×3): 50 mg via ORAL
  Filled 2015-02-16 (×2): qty 1

## 2015-02-16 MED ORDER — NICOTINE 21 MG/24HR TD PT24
MEDICATED_PATCH | TRANSDERMAL | Status: AC
  Administered 2015-02-16: 22:00:00
  Filled 2015-02-16: qty 1

## 2015-02-16 MED ORDER — PAROXETINE HCL 20 MG PO TABS
60.0000 mg | ORAL_TABLET | Freq: Every day | ORAL | Status: DC
  Administered 2015-02-16 – 2015-02-22 (×7): 60 mg via ORAL
  Filled 2015-02-16: qty 3
  Filled 2015-02-16 (×2): qty 9
  Filled 2015-02-16 (×5): qty 3
  Filled 2015-02-16 (×2): qty 2
  Filled 2015-02-16: qty 3

## 2015-02-16 MED ORDER — MAGNESIUM HYDROXIDE 400 MG/5ML PO SUSP: 30.0000 mL | Freq: Every day | ORAL | Status: DC | PRN

## 2015-02-16 MED ORDER — ROSUVASTATIN CALCIUM 10 MG PO TABS
10.0000 mg | ORAL_TABLET | Freq: Every day | ORAL | Status: DC
  Filled 2015-02-16 (×3): qty 1

## 2015-02-16 MED ORDER — DIPHENHYDRAMINE HCL (SLEEP) 25 MG PO TABS: 25.0000 mg | ORAL_TABLET | Freq: Every evening | ORAL | Status: DC | PRN

## 2015-02-16 MED ORDER — PANTOPRAZOLE SODIUM 40 MG PO TBEC
40.0000 mg | DELAYED_RELEASE_TABLET | Freq: Every day | ORAL | Status: DC
  Administered 2015-02-16 – 2015-02-22 (×7): 40 mg via ORAL
  Filled 2015-02-16 (×10): qty 1

## 2015-02-16 MED ORDER — ALUM & MAG HYDROXIDE-SIMETH 200-200-20 MG/5ML PO SUSP
30.0000 mL | ORAL | Status: DC | PRN
  Administered 2015-02-18: 30 mL via ORAL
  Filled 2015-02-16: qty 30

## 2015-02-16 MED ORDER — LITHIUM CARBONATE ER 300 MG PO TBCR
600.0000 mg | EXTENDED_RELEASE_TABLET | Freq: Every day | ORAL | Status: DC
  Administered 2015-02-16 – 2015-02-21 (×6): 600 mg via ORAL
  Filled 2015-02-16 (×4): qty 2
  Filled 2015-02-16 (×2): qty 6
  Filled 2015-02-16 (×4): qty 2

## 2015-02-16 MED ORDER — PNEUMOCOCCAL VAC POLYVALENT 25 MCG/0.5ML IJ INJ
0.5000 mL | INJECTION | INTRAMUSCULAR | Status: AC
Start: 2015-02-17 — End: 2015-02-17
  Administered 2015-02-17: 0.5 mL via INTRAMUSCULAR

## 2015-02-16 MED ORDER — DIAZEPAM 5 MG PO TABS
5.0000 mg | ORAL_TABLET | Freq: Two times a day (BID) | ORAL | Status: DC | PRN
  Administered 2015-02-16 – 2015-02-22 (×13): 5 mg via ORAL
  Filled 2015-02-16 (×13): qty 1

## 2015-02-16 MED ORDER — ENSURE ENLIVE PO LIQD
237.0000 mL | Freq: Two times a day (BID) | ORAL | Status: DC
  Administered 2015-02-17 – 2015-02-22 (×10): 237 mL via ORAL

## 2015-02-16 MED ORDER — ROSUVASTATIN CALCIUM 10 MG PO TABS
10.0000 mg | ORAL_TABLET | Freq: Every day | ORAL | Status: DC
  Administered 2015-02-16 – 2015-02-21 (×6): 10 mg via ORAL
  Filled 2015-02-16 (×10): qty 1

## 2015-02-16 MED ORDER — ARIPIPRAZOLE 2 MG PO TABS
2.0000 mg | ORAL_TABLET | Freq: Every day | ORAL | Status: DC
  Administered 2015-02-16 – 2015-02-19 (×4): 2 mg via ORAL
  Filled 2015-02-16 (×7): qty 1

## 2015-02-16 NOTE — Progress Notes (Signed)
D: Pt denies SI/HI/AV. Pt is pleasant and cooperative. Pt rates depression at a 5, anxiety at a 7, and Helplessness/hopelessness at a 2.  A: Pt was offered support and encouragement. Pt was given scheduled medications. Pt was encourage to attend groups. Q 15 minute checks were done for safety.  R:Pt attends some groups and interacts well with peers and staff. Pt taking medication. Pt has no complaints. Pt receptive to treatment and safety maintained on unit.

## 2015-02-16 NOTE — [facility] Suicide Risk Assessment (Signed)
[facility]   Nursing information obtained from:    Demographic factors:    Current Mental Status:    Loss Factors:    Historical Factors:    Risk Reduction Factors:    Total Time spent with patient: 45 minutes Principal Problem: Severe recurrent major depression without psychotic features Diagnosis:   Patient Active Problem List   Diagnosis Date Noted  . Severe recurrent major depression without psychotic features [F33.2] 02/16/2015  . Alcohol use disorder, severe, in early remission, dependence [F10.21] 02/16/2015  . Abnormal LFTs [R79.89] 02/07/2015  . AA (alcohol abuse) [F10.10] 02/07/2015  . Allergic rhinitis [J30.9] 02/07/2015  . 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 Acid reflux [K21.9] 02/07/2015  . Difficulty hearing [H91.90] 02/07/2015  . HLD (hyperlipidemia) [E78.5] 02/07/2015  . Recurrent major depression-severe [F33.2] 02/07/2015  . Alcohol abuse [F10.10] 02/07/2015  . Panic disorder [F41.0] 02/07/2015     Continued Clinical Symptoms:  Alcohol Use Disorder Identification Test Final Score (AUDIT): 19 The "Alcohol Use Disorders Identification Test", Guidelines for Use in Primary Care, Second Edition.  World Science writerHealth Organization [facility](WHO). Score between 0-7:  no or low risk or alcohol related problems. Score between 8-15:  moderate risk of alcohol related problems. Score between 16-19:  high risk of alcohol related problems. Score 20 or above:  warrants further diagnostic evaluation for alcohol dependence and treatment.   CLINICAL FACTORS:   Depression:   Severe   Psychiatric Specialty Exam: Physical Exam  ROS  Blood pressure 119/74, pulse 79, temperature 98.1 F (36.7 C), temperature source Oral, resp. rate 18, height 6' (1.829 m), weight 97.07 kg (214 lb).Body mass index is 29.02 kg/(m^2).    COGNITIVE FEATURES THAT CONTRIBUTE TO RISK:  Closed-mindedness, Polarized thinking and Thought constriction (tunnel vision)    SUICIDE  RISK:   Moderate:  Frequent suicidal ideation with limited intensity, and duration, some specificity in terms of plans, no associated intent, good self-control, limited dysphoria/symptomatology, some risk factors present, and identifiable protective factors, including available and accessible social support.  PLAN OF CARE: Supportive approach/copign skills                               Depression; continue the Paxil at 60 mg and the Lithium at 600 mg and add Abilify 2 mg daily                                Alcohol Dependence in early remission; continue to work a relapse prevention plan                                CBT/mindfulness  Medical Decision Making:  Review of Psycho-Social Stressors (1), Review or order clinical lab tests (1), Review of Medication Regimen & Side Effects (2) and Review of New Medication or Change in Dosage (2)  I certify that inpatient services furnished can reasonably be expected to improve the patient's condition.   Ladawn Boullion A 02/16/2015, 5:24 PM

## 2015-02-16 NOTE — [facility] Group Notes (Signed)
   [facility] LCSW Group Therapy Note  02/16/2015 10:00 AM  Type of Therapy and Topic:  Group Therapy: Avoiding Self-Sabotaging and Enabling Behaviors  Participation Level:  Did Not Attend    Catherine C Harrill, LCSW 

## 2015-02-16 NOTE — Progress Notes (Signed)
D.  Pt pleasant but flat on approach, complaint that Crestor was scheduled to be taken in AM and Pt takes in evening.  No other complaints voiced.  Positive for evening AA group, interacting appropriately with peers on unit.  Denies SI/HI/hallucinatons at this time.  A.  Support and encouragement offered, spoke with NP to get Crestor time changed to evening.  Pt received evening dose tonight.  R.  Pleased with new Crestor order, remains safe on unit, will continue to monitor.

## 2015-02-16 NOTE — Progress Notes (Signed)
33 year old male pt admitted on voluntary basis. On admiss52ion, pt does endorse depressive symptoms and spoke about wanting to work on his depression as well as anger management issues. Pt denied any SI on admission and able to contract for safety on the unit. Pt reports that he has a supportive girlfriend and family. Pt [facility] dependence and reports that he has had no alcohol in the past couple weeks as he is trying to quit. Pt was oriented to the unit and safety maintained.

## 2015-02-16 NOTE — ED Notes (Signed)
Cochlear implant charger and batteries with case [facility] by pelham with patient

## 2015-02-16 NOTE — H&P (Signed)
Psychiatric Admission Assessment Adult  Patient Identification: Jonathan [name] MRN:  [id number] Date of Evaluation:  02/16/2015 Chief Complaint:  MDD RECURRENT SEVERE ALCOHOL USE DISORDER  Principal Diagnosis: <principal problem not specified> Diagnosis:   Patient Active Problem List   Diagnosis Date Noted  . MDD (major depressive disorder) [F32.2] 02/16/2015  . Abnormal LFTs [R79.89] 02/07/2015  . AA (alcohol abuse) [F10.10] 02/07/2015  . Allergic rhinitis [J30.9] 02/07/2015  . 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 Acid reflux [K21.9] 02/07/2015  . Difficulty hearing [H91.90] 02/07/2015  . HLD (hyperlipidemia) [E78.5] 02/07/2015  . Recurrent major depression-severe [F33.2] 02/07/2015  . Alcohol abuse [F10.10] 02/07/2015  . Panic disorder [F41.0] 02/07/2015   History of Present Illness:: 34 Y/o male who endorses he has dealt with severe depression, suicidal ideas for a year, used to drink five days of the week a pint to a fith a day since he was 19. Has not drank alcohol in 3 weeks. Lost his job in January after 11 years, separated going trough a divorce wife does not let him see his 43 Y/0. He has been on Paxil for years and more recently as he has gotten more depressed it was increased up to 60 mg and Lithium added. He has not seen benefit and had been trying to get in touch with his psychiatrist unsuccessfully.   The initial assessment is as follows: Jonathan [name] is an 34 y.o. male. Patient was brought into the ED by his parents and girlfriend because of symptoms of depression. Patient reports suicidal ideations with no plan. Patient reports having negative thought daily since January. Patient reports in the last years losing his job, marriage ending, and unable to to see his son. Patient denies current symptoms of homicidal ideations, hallucinations, and other self injurious behaviors. Patient reports recently being more paranoid and unable to be alone. When he is left alone he  becomes anxious and tearful. Patient reports only sleeping 3-5 hours a night, low motivations, only eating 1 meal daily, normal hygiene practices, and lost more than 20lbs since February.   Patient reports a history of alcohol use but none in the last 2 weeks. Patient reports in the patient drinking between 2 beers and a half gallon of liquor daily. Patient reports he started drinking at the age of 23 and most recently decided to stop. Patient reports currently receiving treatment from Dr. Weber Cooks and most recently having a medication change.   Elements:  Location:  depression anxiety past alcohol dependence. Quality:  unable to function due to the increased depression, resulting in SI. Severity:  severe. Timing:  every day. Duration:  burilding up worst in the last couple of weeks. Context:  alcohol dependence abstinent for 3 weeks, increased depression with SI stress of having lost a job he liked, now working a job that does not give him enough money dealing with a divorce and not being able so see his son. Associated Signs/Symptoms: Depression Symptoms:  depressed mood, anhedonia, insomnia, fatigue, feelings of worthlessness/guilt, difficulty concentrating, hopelessness, suicidal thoughts with specific plan, anxiety, panic attacks, loss of energy/fatigue, disturbed sleep, weight loss, decreased appetite, (Hypo) Manic Symptoms:  Impulsivity, Irritable Mood, Labiality of Mood, Anxiety Symptoms:  Excessive Worry, Panic Symptoms, Psychotic Symptoms:  Paranoia, PTSD Symptoms: Negative Total Time spent with patient: 45 minutes  Past Medical History:  Past Medical History  Diagnosis Date  . Liver disease   . Gastric reflux   . Panic disorder   . Anxiety   . Depression  Past Surgical History  Procedure Laterality Date  . Colon surgery    . Ear surgey     Family History:  Family History  Problem Relation Age of Onset  . Diabetes Mother   . Depression Mother   .  Anxiety disorder Mother   . COPD Mother   . Heart attack Father   . Diabetes Father    Social History:  History  Alcohol Use  . 8.4 oz/week  . 12 Cans of beer, 2 Shots of liquor, 0 Standard drinks or equivalent per week    Comment: trying to quit last about 2 weeks ago     History  Drug Use No    History   Social History  . Marital Status: Married    Spouse Name: N/A  . Number of Children: N/A  . Years of Education: N/A   Social History Main Topics  . Smoking status: Current Every Day Smoker -- 1.00 packs/day    Types: Cigarettes    Start date: 02/07/2000  . Smokeless tobacco: Current User    Types: Snuff, Chew  . Alcohol Use: 8.4 oz/week    12 Cans of beer, 2 Shots of liquor, 0 Standard drinks or equivalent per week     Comment: trying to quit last about 2 weeks ago  . Drug Use: No  . Sexual Activity: Yes    Birth Control/ Protection: None   Other Topics Concern  . None   Social History Narrative  Lives with girlfriend, after he lost his job was unemployed for a while. Got a job in March but does not make enough money, does not have benefits. Has an Associates in Business administration and a real state certificate. Did well in school. Theodoro Kos is part of his life.  Additional Social History:                          Musculoskeletal: Strength & Muscle Tone: within normal limits Gait & Station: normal Patient leans: normal  Psychiatric Specialty Exam: Physical Exam  Review of Systems  Constitutional: Positive for weight loss and malaise/fatigue.  HENT: Positive for hearing loss.        Frontal, er infections as a baby, has an implant both ears  Eyes: Negative.   Respiratory: Positive for cough and shortness of breath.        2 packs  Cardiovascular: Negative.   Gastrointestinal: Positive for heartburn, nausea, diarrhea and blood in stool.  Genitourinary: Negative.   Musculoskeletal: Positive for back pain.  Skin: Positive for itching.   Neurological: Positive for dizziness, weakness and headaches.  Endo/Heme/Allergies: Negative.   Psychiatric/Behavioral: Positive for depression and suicidal ideas. The patient is nervous/anxious and has insomnia.     Blood pressure 119/74, pulse 79, temperature 98.1 F (36.7 C), temperature source Oral, resp. rate 18, height 6' (1.829 m), weight 97.07 kg (214 lb).Body mass index is 29.02 kg/(m^2).  General Appearance: Disheveled  Eye Sport and exercise psychologist::  Fair  Speech:  Clear and Coherent and has a hearing impediment   Volume:  Decreased  Mood:  Anxious, Depressed and Dysphoric  Affect:  Flat and anxious worried  Thought Process:  Coherent and Goal Directed  Orientation:  Full (Time, Place, and Person)  Thought Content:  symptoms events worries concerns  Suicidal Thoughts:  Yes, no intent or plan  Homicidal Thoughts:  No  Memory:  Immediate;   Fair Recent;   Fair Remote;   Fair  Judgement:  Fair  Insight:  Present  Psychomotor Activity:  Restlessness  Concentration:  Fair  Recall:  [employer] of Knowledge:Fair  Language: Fair  Akathisia:  No  Handed:  Right  AIMS (if indicated):     Assets:  Desire for Improvement Housing Social Support  ADL's:  Intact  Cognition: WNL  Sleep:      Risk to Self: Is patient at risk for suicide?: Yes Risk to Others:   Prior Inpatient Therapy:  [facility] 2013 worked for couple of months but thought he could take a drink on his own Prior Outpatient Therapy:  Dr. Lucrezia Europe. Since 2013.   Alcohol Screening: 1. How often do you have a drink containing alcohol?: 4 or more times a week 2. How many drinks containing alcohol do you have on a typical day when you are drinking?: 7, 8, or 9 3. How often do you have six or more drinks on one occasion?: Weekly Preliminary Score: 6 4. How often during the last year have you found that you were not able to stop drinking once you had started?: Monthly 5. How often during the last year have you failed to do what was  normally expected from you becasue of drinking?: Less than monthly 6. How often during the last year have you needed a first drink in the morning to get yourself going after a heavy drinking session?: Never 7. How often during the last year have you had a feeling of guilt of remorse after drinking?: Less than monthly 8. How often during the last year have you been unable to remember what happened the night before because you had been drinking?: Less than monthly 9. Have you or someone else been injured as a result of your drinking?: No 10. Has a relative or friend or a doctor or another health worker been concerned about your drinking or suggested you cut down?: Yes, during the last year Alcohol Use Disorder Identification Test Final Score (AUDIT): 19 Brief Intervention: Yes  Allergies:  No Known Allergies Lab Results:  Results for orders placed or performed during the hospital encounter of 02/15/15 (from the past 48 hour(s))  Acetaminophen level     Status: Abnormal   Collection Time: 02/15/15  3:19 PM  Result Value Ref Range   Acetaminophen (Tylenol), Serum <10 (L) 10 - 30 ug/mL    Comment:        THERAPEUTIC CONCENTRATIONS VARY SIGNIFICANTLY. A RANGE OF 10-30 ug/mL MAY BE AN EFFECTIVE CONCENTRATION FOR MANY PATIENTS. HOWEVER, SOME ARE BEST TREATED AT CONCENTRATIONS OUTSIDE THIS RANGE. ACETAMINOPHEN CONCENTRATIONS >150 ug/mL AT 4 HOURS AFTER INGESTION AND >50 ug/mL AT 12 HOURS AFTER INGESTION ARE OFTEN ASSOCIATED WITH TOXIC REACTIONS.   CBC     Status: Abnormal   Collection Time: 02/15/15  3:19 PM  Result Value Ref Range   WBC 11.3 (H) 4.0 - 10.5 K/uL   RBC 5.51 4.22 - 5.81 MIL/uL   Hemoglobin 16.9 13.0 - 17.0 g/dL   HCT 49.7 39.0 - 52.0 %   MCV 90.2 78.0 - 100.0 fL   MCH 30.7 26.0 - 34.0 pg   MCHC 34.0 30.0 - 36.0 g/dL   RDW 12.3 11.5 - 15.5 %   Platelets 353 150 - 400 K/uL  Comprehensive metabolic panel     Status: Abnormal   Collection Time: 02/15/15  3:19 PM  Result  Value Ref Range   Sodium 137 135 - 145 mmol/L   Potassium 4.4 3.5 - 5.1 mmol/L   Chloride 99 (L) 101 -  111 mmol/L   CO2 28 22 - 32 mmol/L   Glucose, Bld 116 (H) 65 - 99 mg/dL   BUN 8 6 - 20 mg/dL   Creatinine, Ser 1.21 0.61 - 1.24 mg/dL   Calcium 10.0 8.9 - 10.3 mg/dL   Total Protein 7.4 6.5 - 8.1 g/dL   Albumin 4.4 3.5 - 5.0 g/dL   AST 24 15 - 41 U/L   ALT 37 17 - 63 U/L   Alkaline Phosphatase 67 38 - 126 U/L   Total Bilirubin 0.6 0.3 - 1.2 mg/dL   GFR calc non Af Amer >60 >60 mL/min   GFR calc Af Amer >60 >60 mL/min    Comment: (NOTE) The eGFR has been calculated using the CKD EPI equation. This calculation has not been validated in all clinical situations. eGFR's persistently <60 mL/min signify possible Chronic Kidney Disease.    Anion gap 10 5 - 15  Ethanol (ETOH)     Status: None   Collection Time: 02/15/15  3:19 PM  Result Value Ref Range   Alcohol, Ethyl (B) <5 <5 mg/dL    Comment:        LOWEST DETECTABLE LIMIT FOR SERUM ALCOHOL IS 5 mg/dL FOR MEDICAL PURPOSES ONLY   Salicylate level     Status: None   Collection Time: 02/15/15  3:19 PM  Result Value Ref Range   Salicylate Lvl <0.9 2.8 - 30.0 mg/dL  Urine rapid drug screen (hosp performed)not at [facility]     Status: Abnormal   Collection Time: 02/15/15  4:03 PM  Result Value Ref Range   Opiates NONE DETECTED NONE DETECTED   Cocaine NONE DETECTED NONE DETECTED   Benzodiazepines POSITIVE (A) NONE DETECTED   Amphetamines NONE DETECTED NONE DETECTED   Tetrahydrocannabinol NONE DETECTED NONE DETECTED   Barbiturates NONE DETECTED NONE DETECTED    Comment:        DRUG SCREEN FOR MEDICAL PURPOSES ONLY.  IF CONFIRMATION IS NEEDED FOR ANY PURPOSE, NOTIFY LAB WITHIN 5 DAYS.        LOWEST DETECTABLE LIMITS FOR URINE DRUG SCREEN Drug Class       Cutoff (ng/mL) Amphetamine      1000 Barbiturate      200 Benzodiazepine   381 Tricyclics       829 Opiates          300 Cocaine          300 THC              50     Current Medications: Current Facility-Administered Medications  Medication Dose Route Frequency Provider Last Rate Last Dose  . alum & mag hydroxide-simeth (MAALOX/MYLANTA) 200-200-20 MG/5ML suspension 30 mL  30 mL Oral Q4H PRN Harriet Butte, NP      . diphenhydrAMINE (BENADRYL) capsule 25 mg  25 mg Oral QHS PRN Nicholaus Bloom, MD      . feeding supplement (ENSURE ENLIVE) (ENSURE ENLIVE) liquid 237 mL  237 mL Oral BID BM Nicholaus Bloom, MD      . lithium carbonate (LITHOBID) CR tablet 600 mg  600 mg Oral QHS Harriet Butte, NP      . magnesium hydroxide (MILK OF MAGNESIA) suspension 30 mL  30 mL Oral Daily PRN Harriet Butte, NP      . pantoprazole (PROTONIX) EC tablet 40 mg  40 mg Oral Daily Harriet Butte, NP   40 mg at 02/16/15 0901  . PARoxetine (PAXIL) tablet 60 mg  60 mg Oral Daily Harriet Butte, NP   60 mg at 02/16/15 0901  . [START ON 02/17/2015] pneumococcal 23 valent vaccine (PNU-IMMUNE) injection 0.5 mL  0.5 mL Intramuscular Tomorrow-1000 Nicholaus Bloom, MD      . rosuvastatin (CRESTOR) tablet 10 mg  10 mg Oral Daily Harriet Butte, NP   10 mg at 02/16/15 0902   PTA Medications: Prescriptions prior to admission  Medication Sig Dispense Refill Last Dose  . dexlansoprazole (DEXILANT) 60 MG capsule Take 60 mg by mouth daily.   02/14/2015 at Unknown time  . diazepam (VALIUM) 10 MG tablet Take 0.5 tablets (5 mg total) by mouth every 12 (twelve) hours as needed for anxiety. 30 tablet 0 02/14/2015 at Unknown time  . diphenhydrAMINE (SOMINEX) 25 MG tablet Take 25 mg by mouth at bedtime as needed for itching or sleep.   02/14/2015 at Unknown time  . Flaxseed, Linseed, (FLAX SEED OIL PO) Take 1 capsule by mouth daily.   02/14/2015 at Unknown time  . lithium carbonate (LITHOBID) 300 MG CR tablet Take 2 tablets (600 mg total) by mouth at bedtime. 60 tablet 0 02/14/2015 at Unknown time  . PARoxetine (PAXIL) 20 MG tablet Take 3 tablets (60 mg total) by mouth daily. 90 tablet 0 02/14/2015 at  Unknown time  . rosuvastatin (CRESTOR) 10 MG tablet Take 10 mg by mouth daily.    02/14/2015 at Unknown time    Previous Psychotropic Medications: Yes Paxil increased to 60 mg, Valium, was recently placed on Lithium   Substance Abuse History in the last 12 months:  Yes.      Consequences of Substance Abuse: Legal Consequences:  2 DWI'S Blackouts:    Results for orders placed or performed during the hospital encounter of 02/15/15 (from the past 72 hour(s))  Acetaminophen level     Status: Abnormal   Collection Time: 02/15/15  3:19 PM  Result Value Ref Range   Acetaminophen (Tylenol), Serum <10 (L) 10 - 30 ug/mL    Comment:        THERAPEUTIC CONCENTRATIONS VARY SIGNIFICANTLY. A RANGE OF 10-30 ug/mL MAY BE AN EFFECTIVE CONCENTRATION FOR MANY PATIENTS. HOWEVER, SOME ARE BEST TREATED AT CONCENTRATIONS OUTSIDE THIS RANGE. ACETAMINOPHEN CONCENTRATIONS >150 ug/mL AT 4 HOURS AFTER INGESTION AND >50 ug/mL AT 12 HOURS AFTER INGESTION ARE OFTEN ASSOCIATED WITH TOXIC REACTIONS.   CBC     Status: Abnormal   Collection Time: 02/15/15  3:19 PM  Result Value Ref Range   WBC 11.3 (H) 4.0 - 10.5 K/uL   RBC 5.51 4.22 - 5.81 MIL/uL   Hemoglobin 16.9 13.0 - 17.0 g/dL   HCT 49.7 39.0 - 52.0 %   MCV 90.2 78.0 - 100.0 fL   MCH 30.7 26.0 - 34.0 pg   MCHC 34.0 30.0 - 36.0 g/dL   RDW 12.3 11.5 - 15.5 %   Platelets 353 150 - 400 K/uL  Comprehensive metabolic panel     Status: Abnormal   Collection Time: 02/15/15  3:19 PM  Result Value Ref Range   Sodium 137 135 - 145 mmol/L   Potassium 4.4 3.5 - 5.1 mmol/L   Chloride 99 (L) 101 - 111 mmol/L   CO2 28 22 - 32 mmol/L   Glucose, Bld 116 (H) 65 - 99 mg/dL   BUN 8 6 - 20 mg/dL   Creatinine, Ser 1.21 0.61 - 1.24 mg/dL   Calcium 10.0 8.9 - 10.3 mg/dL   Total Protein 7.4 6.5 - 8.1 g/dL  Albumin 4.4 3.5 - 5.0 g/dL   AST 24 15 - 41 U/L   ALT 37 17 - 63 U/L   Alkaline Phosphatase 67 38 - 126 U/L   Total Bilirubin 0.6 0.3 - 1.2 mg/dL   GFR calc  non Af Amer >60 >60 mL/min   GFR calc Af Amer >60 >60 mL/min    Comment: (NOTE) The eGFR has been calculated using the CKD EPI equation. This calculation has not been validated in all clinical situations. eGFR's persistently <60 mL/min signify possible Chronic Kidney Disease.    Anion gap 10 5 - 15  Ethanol (ETOH)     Status: None   Collection Time: 02/15/15  3:19 PM  Result Value Ref Range   Alcohol, Ethyl (B) <5 <5 mg/dL    Comment:        LOWEST DETECTABLE LIMIT FOR SERUM ALCOHOL IS 5 mg/dL FOR MEDICAL PURPOSES ONLY   Salicylate level     Status: None   Collection Time: 02/15/15  3:19 PM  Result Value Ref Range   Salicylate Lvl <6.4 2.8 - 30.0 mg/dL  Urine rapid drug screen (hosp performed)not at [facility]     Status: Abnormal   Collection Time: 02/15/15  4:03 PM  Result Value Ref Range   Opiates NONE DETECTED NONE DETECTED   Cocaine NONE DETECTED NONE DETECTED   Benzodiazepines POSITIVE (A) NONE DETECTED   Amphetamines NONE DETECTED NONE DETECTED   Tetrahydrocannabinol NONE DETECTED NONE DETECTED   Barbiturates NONE DETECTED NONE DETECTED    Comment:        DRUG SCREEN FOR MEDICAL PURPOSES ONLY.  IF CONFIRMATION IS NEEDED FOR ANY PURPOSE, NOTIFY LAB WITHIN 5 DAYS.        LOWEST DETECTABLE LIMITS FOR URINE DRUG SCREEN Drug Class       Cutoff (ng/mL) Amphetamine      1000 Barbiturate      200 Benzodiazepine   680 Tricyclics       321 Opiates          300 Cocaine          300 THC              50     Observation Level/Precautions:  15 minute checks  Laboratory:  As per the ED  Psychotherapy:  Individual/group  Medications:  Will continue the Paxil 60 and the Lithium and will add Abilify  Consultations:    Discharge Concerns:    Estimated LOS: 3-5 days  Other:     Psychological Evaluations: No   Treatment Plan Summary: Daily contact with patient to assess and evaluate symptoms and progress in treatment and Medication management  Supportive approach/coping  skills Alcohol dependence; work a relapse prevention plan Major Depression; continue to work with the Paxil up to 60 mg daily, continue the Lithium Carbonate, and add Abilify 2 mg daily Use CBT/mindfulness  Medical Decision Making:  Review of Psycho-Social Stressors (1), Review or order clinical lab tests (1), Review of Medication Regimen & Side Effects (2) and Review of New Medication or Change in Dosage (2)  I certify that inpatient services furnished can reasonably be expected to improve the patient's condition.   Adelma Bowdoin A 7/2/20169:12 AM

## 2015-02-16 NOTE — Plan of Care (Signed)
Problem: Alteration in mood Goal: LTG-Pt's behavior demonstrates decreased signs of depression (Patient's behavior demonstrates decreased signs of depression to the point the patient is safe to return home and continue treatment in an outpatient setting)  Outcome: Progressing Pt observed in dayroom laughing loudly and playing card with peers.

## 2015-02-16 NOTE — Progress Notes (Signed)
NUTRITION ASSESSMENT  Pt identified as at risk on the Malnutrition Screen Tool  INTERVENTION: 1. Educated patient on the importance of nutrition and encouraged intake of food and beverages. 2. Discussed weight goals. 3. Supplements: continue Ensure Enlive po BID, each supplement provides 350 kcal and 20 grams of protein  NUTRITION DIAGNOSIS: Unintentional weight loss related to sub-optimal intake as evidenced by pt report.   Goal: Pt to meet >/= 90% of their estimated nutrition needs.  Monitor:  PO intake  Assessment:  Pt seen for MST. Pt admitted for SI and depression with recent job loss and going through a divorce. Weight has been stable x9 days per weight hx review. Ensure Enlive ordered BID.  34 y.o. male  Height: Ht Readings from Last 1 Encounters:  02/16/15 6' (1.829 m)    Weight: Wt Readings from Last 1 Encounters:  02/16/15 214 lb (97.07 kg)    Weight Hx: Wt Readings from Last 10 Encounters:  02/16/15 214 lb (97.07 kg)  02/15/15 213 lb (96.616 kg)  02/07/15 214 lb 9.6 oz (97.342 kg)    BMI:  Body mass index is 29.02 kg/(m^2). Pt meets criteria for overweight status based on current BMI.  Estimated Nutritional Needs: Kcal: 25-30 kcal/kg Protein: > 1 gram protein/kg Fluid: 1 ml/kcal  Diet Order: Diet regular Room service appropriate?: Yes; Fluid consistency:: Thin Pt is also offered choice of unit snacks mid-morning and mid-afternoon.  Pt is eating as desired.   Lab results and medications reviewed.     Trenton GammonJessica Ximenna Fonseca, RD, LDN Inpatient Clinical Dietitian Pager # [phone] After hours/weekend pager # [phone]

## 2015-02-16 NOTE — Tx Team (Signed)
Initial Interdisciplinary Treatment Plan   PATIENT STRESSORS: Marital or family conflict Occupational concerns Substance abuse   PATIENT STRENGTHS: Ability for insight Average or above average intelligence Capable of independent living General fund of knowledge Motivation for treatment/growth Supportive family/friends   PROBLEM LIST: Problem List/Patient Goals Date to be addressed Date deferred Reason deferred Estimated date of resolution  Depression 02/16/15     Suicidal Ideation 02/16/15     Alcohol Abuse 02/16/15     "Been depressed, I snap easy and might want to work on anger management" 02/16/15                                    DISCHARGE CRITERIA:  Ability to meet basic life and health needs Improved stabilization in mood, thinking, and/or behavior Verbal commitment to aftercare and medication compliance  PRELIMINARY DISCHARGE PLAN: Attend aftercare/continuing care group Return to previous living arrangement  PATIENT/FAMIILY INVOLVEMENT: This treatment plan has been presented to and reviewed with the patient, Nadara ModeJames W Roza, and/or family member, .  The patient and family have been given the opportunity to ask questions and make suggestions.  Chaden Doom, [place] 02/16/2015, 4:28 AM

## 2015-02-16 NOTE — ED Provider Notes (Signed)
[facility] by Dr. Dub MikesLugo.   [name], MD 02/16/15 [phone]

## 2015-02-16 NOTE — Progress Notes (Signed)
Patient attended AA speaker group tonight.  

## 2015-02-17 MED ORDER — DOXYCYCLINE HYCLATE 100 MG PO TABS
ORAL_TABLET | ORAL | Status: AC
  Administered 2015-02-17: 17:00:00
  Filled 2015-02-17: qty 1

## 2015-02-17 MED ORDER — ACAMPROSATE CALCIUM 333 MG PO TBEC
666.0000 mg | DELAYED_RELEASE_TABLET | Freq: Three times a day (TID) | ORAL | Status: DC
  Administered 2015-02-17 – 2015-02-22 (×15): 666 mg via ORAL
  Filled 2015-02-17 (×6): qty 2
  Filled 2015-02-17: qty 18
  Filled 2015-02-17 (×5): qty 2
  Filled 2015-02-17: qty 18
  Filled 2015-02-17 (×5): qty 2
  Filled 2015-02-17 (×3): qty 18
  Filled 2015-02-17 (×2): qty 2
  Filled 2015-02-17: qty 18
  Filled 2015-02-17: qty 2

## 2015-02-17 MED ORDER — DOXYCYCLINE HYCLATE 100 MG PO TABS
100.0000 mg | ORAL_TABLET | Freq: Two times a day (BID) | ORAL | Status: DC
Start: 2015-02-17 — End: 2015-02-24
  Administered 2015-02-17 – 2015-02-22 (×10): 100 mg via ORAL
  Filled 2015-02-17 (×19): qty 1

## 2015-02-17 MED ORDER — NICOTINE 21 MG/24HR TD PT24
21.0000 mg | MEDICATED_PATCH | Freq: Every day | TRANSDERMAL | Status: DC
Start: 2015-02-17 — End: 2015-02-24
  Administered 2015-02-17 – 2015-02-22 (×6): 21 mg via TRANSDERMAL
  Filled 2015-02-17 (×9): qty 1

## 2015-02-17 NOTE — Progress Notes (Signed)
Patient attended AA Speaker group tonight.  

## 2015-02-17 NOTE — Plan of Care (Signed)
Problem: Ineffective individual coping Goal: STG: Patient will remain free from self harm Outcome: Progressing Patient has not engaged in self harm and denies SI  Problem: Diagnosis: Increased Risk For Suicide Attempt Goal: STG-Patient Will Comply With Medication Regime Outcome: Progressing Patient is med compliant.     

## 2015-02-17 NOTE — Progress Notes (Signed)
D.  Pt pleasant on approach, complaint of continued anxiety.  Minimal withdrawal.  Positive for evening AA group, interacting appropriately with with peers on the unit.  Denies SI/HI/hallucinations at this time.  A.  Support and encouragement offered, medications given as ordered for anxiety.  R.  Pt remains safe on the unit, will continue to monitor.

## 2015-02-17 NOTE — Progress Notes (Signed)
Patient has been asleep in bed this morning. "I don't sleep well usually. I'm really sleepy today." Patient flat in affect, mood depressed. Forwards minimal information. Medicated per orders, support offered. Encouraged patient to complete self inventory for today as well as to participate in programming. Patient somewhat receptive. He denies SI/HI. No pain or physical issues voiced. Patient safe. Lawrence MarseillesFriedman, Kataleia Quaranta Eakes

## 2015-02-17 NOTE — [facility] Counselor (Signed)
Adult Comprehensive Assessment  Patient ID: Wah W Whitehair, male   DOB: 1980-08-25, 34 y.o.   MRN: [id number]  Information Source: Information source: Patient  Current StressNadara Modeors:  Educational / Learning stressors: Patient denies. 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 Patient currently has a job but he is not making what he would like to make or what he made it at his previous job. Family Relationships: Patient stated, "Family is trying to help, wish me and brother were a little closer."  Financial / Lack of resources (include bankruptcy): Patient stated he is relying on GF right now since [facility] and not working. Housing / Lack of housing: Patient stated he does not want to lose housing.  Physical health (include injuries & life threatening diseases): Patient stated mental health is causing stress. Social relationships: Patient denies. Substance abuse: Patient has been sober from alcohol for three weeks. Bereavement / Loss: Patient denies.   Living/Environment/Situation:  Living Arrangements: Spouse/significant other (Patient lives with GF.) Living conditions (as described by patient or guardian): Patient stated GF is there for him while he's going through a divorce, can't see his son, lost his job, received a cut in his salary, "she's there to help me." How long has patient lived in current situation?: Since December 2015.  What is atmosphere in current home: Supportive  Family History:  Marital status: Separated Separated, when?: November 2015. What types of issues is patient dealing with in the relationship?: Alcoholism for patient and no trust with wife. Patient stated wife spent a considerable amount of time going through his phone and thinking negative. Patient additionally stated they had nothing in common.  Does patient have children?: Yes How many children?: 1 How is patient's  relationship with their children?: Patient's relationship with son is currently stalled because  "I haven't seen him since November."   Childhood History:  By whom was/is the patient raised?: Both parents Description of patient's relationship with caregiver when they were a child: Relationship with parents was good, "they gave me anything we wanted. We didn't lack nothing me and my brother." Patient's description of current relationship with people who raised him/her: "Parents still do anything they can for us." Does patient have siblings?: Yes Number of Siblings: 1 Description of patient's current relationship with siblings: Patient stated he wished he and brother were closer.  Did patient suffer any verbal/emotional/physical/sexual abuse as a child?: No Did patient suffer from severe childhood neglect?: No Has patient ever been sexually abused/assaulted/raped as an adolescent or adult?: No Was the patient ever a victim of a crime or a disaster?: No Witnessed domestic violence?: No Has patient been effected by domestic violence as an adult?: No  Education:  Highest grade of school patient has completed: Scientist, research (physical sciences)Associates Degree Currently a student?: No Learning disability?: No  Employment/Work Situation:   Employment situation: Employed Where is patient currently employed?: [employer]  How long has patient been employed?: 3 months Patient's job has been impacted by current illness: Yes Describe how patient's job has been impacted: Patient denies.  What is the longest time patient has a held a job?: 11 years Where was the patient employed at that time?: [employer] Has patient ever been in the [employer] and Companymilitary?: No Has patient ever served in combat?: No  Financial Resources:   Surveyor, quantityinancial resources: Income from spouse, Income from employment Does patient have a Lawyerrepresentative payee or  guardian?: No  Alcohol/Substance Abuse:   What has been your use of drugs/alcohol within the  last 12 months?: Drinking has been consistent the last 12 months. Patient stated he consumes alcohol everyday, but unable to provide amount.  If attempted suicide, did drugs/alcohol play a role in this?: No Alcohol/Substance Abuse Treatment Hx: Attends AA/NA If yes, describe treatment: Patient has attended one AA meeting. Has alcohol/substance abuse ever caused legal problems?: Yes (DUI)  Social Support System:   Patient's Community Support System: Good Describe Community Support System: GF and parents. Type of faith/religion: Baptist How does patient's faith help to cope with current illness?: Patient stated, "Right now that's all I'm relying on is God to keep me from drinking."   Leisure/Recreation:   Leisure and Hobbies: Patient stated he likes to work outside and [employer] as well as fish.  Strengths/Needs:   What things does the patient do well?: Patient stated he is good with fishing and plants. In what areas does patient struggle / problems for patient: Coping with problems.   Discharge Plan:   Does patient have access to transportation?: Yes (GF) Will patient be returning to same living situation after discharge?: Yes Currently receiving community mental health services: Yes (From Whom) ([facility] (psychiatrist)) If no, would patient like referral for services when discharged?: Yes (What county?) [facility]) Does patient have financial barriers related to discharge medications?: No  Summary/Recommendations:   Patient is a 34 year old Caucasian male living in [place], [place]. Patient presents voluntarily with a diagnosis of MD. Patient has been experiencing increasing anxiety since losing job in January 2016. Patient listed stressors of divorce, not being able to see his child, and a reduction in finances as main triggers for symptoms and suicidal ideation. Patient stated he is fearful to be alone because of the rumination of thoughts which increase his feelings  of sadness. Patient lists his parents and GF as his support. Patient was being treated by Dr. Toni Amend, but patient has decided he wants to begin treatment with another psychiatrist. Patient expressed overwhelming sadness at not being able to see or contact son.  Recommendation: Patient would benefit from crisis stabilization, mediation management for mood stabilization, therapy groups for processing, psycho educational group for increasing coping skills, and aftercare planning. Patient has Declined referral to Quitline for smoking cessation as he is currently using patch while hospitalized. The Discharge Process and Patient Involvement form was reviewed with patient at the end of the Psychosocial Assessment, and the patient confirmed understanding and signed that document, which was placed in the paper chart. Suicide Prevention Education was reviewed thoroughly, and a brochure left with patient. The patient ACCEPTED for SPE to be provided to his GF and mother.     Patrick-Jefferson,Anjeanette Petzold. 02/17/2015

## 2015-02-17 NOTE — Progress Notes (Signed)
[facility] MD Progress Note  02/17/2015 7:25 PM Jonathan [name]  MRN:  [id number] Subjective:  Jonathan [name] is having a hard time today. States that he regrets the years of drinking and using drugs. States that losing the job he liked so much and not being able to see his kid have contributed to the way he has been feeling. 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 He needs the surgery on the left ear but states it is a 65, 000 dollars surgery and he does not have the means. He quit drinking 3 weeks ago as he was afraid to lose his GF. He describes mood swings suggestive of hypomania with the depression but states he was drinking most of the times this was going on. He has family of Bipolar disorder for what his family wants him to ask if he has it as they have seen the same mood swings in him Principal Problem: Severe recurrent major depression without psychotic features Diagnosis:   Patient Active Problem List   Diagnosis Date Noted  . Severe recurrent major depression without psychotic features [F33.2] 02/16/2015  . Alcohol use disorder, severe, in early remission, dependence [F10.21] 02/16/2015  . Abnormal LFTs [R79.89] 02/07/2015  . AA (alcohol abuse) [F10.10] 02/07/2015  . Allergic rhinitis [J30.9] 02/07/2015  . 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 Acid reflux [K21.9] 02/07/2015  . Difficulty hearing [H91.90] 02/07/2015  . HLD (hyperlipidemia) [E78.5] 02/07/2015  . Recurrent major depression-severe [F33.2] 02/07/2015  . Alcohol abuse [F10.10] 02/07/2015  . Panic disorder [F41.0] 02/07/2015   Total Time spent with patient: 30 minutes   Past Medical History:  Past Medical History  Diagnosis Date  . Liver disease   . Gastric reflux   . Panic disorder   . Anxiety   . Depression     Past Surgical History  Procedure Laterality Date  . Colon surgery    . Ear surgey     Family  History:  Family History  Problem Relation Age of Onset  . Diabetes Mother   . Depression Mother   . Anxiety disorder Mother   . COPD Mother   . Heart attack Father   . Diabetes Father    Social History:  History  Alcohol Use  . 8.4 oz/week  . 12 Cans of beer, 2 Shots of liquor, 0 Standard drinks or equivalent per week    Comment: trying to quit last about 2 weeks ago     History  Drug Use No    History   Social History  . Marital Status: Married    Spouse Name: N/A  . Number of Children: N/A  . Years of Education: N/A   Social History Main Topics  . Smoking status: Current Every Day Smoker -- 1.00 packs/day    Types: Cigarettes    Start date: 02/07/2000  . Smokeless tobacco: Current User    Types: Snuff, Chew  . Alcohol Use: 8.4 oz/week    12 Cans of beer, 2 Shots of liquor, 0 Standard drinks or equivalent per week     Comment: trying to quit last about 2 weeks ago  . Drug Use: No  . Sexual Activity: Yes    Birth Control/ Protection: None   Other Topics Concern  . None   Social History Narrative   Additional History:    Sleep: Fair  Appetite:  Fair  Assessment:   Musculoskeletal: Strength & Muscle Tone: within normal limits Gait & Station: normal Patient leans: normal   Psychiatric Specialty Exam: Physical Exam  Review of Systems  Constitutional: Positive for malaise/fatigue.  HENT: Positive for hearing loss.   Eyes: Negative.   Respiratory: Negative.   Cardiovascular: Negative.   Gastrointestinal: Negative.   Genitourinary: Negative.   Musculoskeletal: Negative.   Skin: Positive for rash.       Well developed round lesion. Seen by DNP and suggested tick bite.   Neurological: Negative.   Endo/Heme/Allergies: Negative.   Psychiatric/Behavioral: Positive for depression and substance abuse. The patient is nervous/anxious.     Blood pressure 101/81, pulse 97, temperature 97.9 F (36.6 C), temperature source Oral, resp. rate 18, height 6'  (1.829 m), weight 97.07 kg (214 lb).Body mass index is 29.02 kg/(m^2).  General Appearance: Fairly Groomed  Patent attorney::  Fair  Speech:  Clear and Coherent  Volume:  Normal  Mood:  Anxious and Depressed  Affect:  Depressed  Thought Process:  Coherent and Goal Directed  Orientation:  Full (Time, Place, and Person)  Thought Content:  symptoms events worries concerns  Suicidal Thoughts:  not today  Homicidal Thoughts:  No  Memory:  Immediate;   Fair Recent;   Fair Remote;   Fair  Judgement:  Fair  Insight:  Present  Psychomotor Activity:  Restlessness  Concentration:  Fair  Recall:  [employer] of Knowledge:Fair  Language: Fair  Akathisia:  No  Handed:  Right  AIMS (if indicated):     Assets:  Desire for Improvement  ADL's:  Intact  Cognition: WNL  Sleep:        Current Medications: Current Facility-Administered Medications  Medication Dose Route Frequency Provider Last Rate Last Dose  . acamprosate (CAMPRAL) tablet 666 mg  666 mg Oral TID WC Rachael Fee, MD   666 mg at 02/17/15 1706  . alum & mag hydroxide-simeth (MAALOX/MYLANTA) 200-200-20 MG/5ML suspension 30 mL  30 mL Oral Q4H PRN Worthy Flank, NP      . ARIPiprazole (ABILIFY) tablet 2 mg  2 mg Oral Daily Rachael Fee, MD   2 mg at 02/17/15 0809  . diazepam (VALIUM) tablet 5 mg  5 mg Oral BID PRN Rachael Fee, MD   5 mg at 02/17/15 1706  . diphenhydrAMINE (BENADRYL) capsule 25 mg  25 mg Oral QHS PRN Rachael Fee, MD   25 mg at 02/16/15 2158  . doxycycline (VIBRA-TABS) tablet 100 mg  100 mg Oral BID Beau Fanny, FNP      . feeding supplement (ENSURE ENLIVE) (ENSURE ENLIVE) liquid 237 mL  237 mL Oral BID BM Rachael Fee, MD   237 mL at 02/17/15 1001  . lithium carbonate (LITHOBID) CR tablet 600 mg  600 mg Oral QHS Worthy Flank, NP   600 mg at 02/16/15 2158  . magnesium hydroxide (MILK OF MAGNESIA) suspension 30 mL  30 mL Oral Daily PRN Worthy Flank, NP      . nicotine (NICODERM CQ - dosed in mg/24 hours)  patch 21 mg  21 mg Transdermal Daily Rachael Fee, MD   21 mg at 02/17/15 1610  . pantoprazole (PROTONIX) EC tablet 40 mg  40 mg Oral Daily Worthy Flank, NP   40 mg at 02/17/15 0809  . PARoxetine (PAXIL) tablet 60 mg  60 mg Oral Daily Worthy Flank, NP   60 mg at 02/17/15 0809  .  rosuvastatin (CRESTOR) tablet 10 mg  10 mg Oral q1800 Beau FannyJohn C Withrow, FNP   10 mg at 02/17/15 1706  . traZODone (DESYREL) tablet 50 mg  50 mg Oral QHS PRN Rachael FeeIrving A Kaeden Depaz, MD   50 mg at 02/16/15 2158    Lab Results: No results found for this or any previous visit (from the past 48 hour(s)).  Physical Findings: AIMS: Facial and Oral Movements Muscles of Facial Expression: None, normal Lips and Perioral Area: None, normal Jaw: None, normal Tongue: None, normal,Extremity Movements Upper (arms, wrists, hands, fingers): None, normal Lower (legs, knees, ankles, toes): None, normal, Trunk Movements Neck, shoulders, hips: None, normal, Overall Severity Severity of abnormal movements (highest score from questions above): None, normal Incapacitation due to abnormal movements: None, normal Patient's awareness of abnormal movements (rate only patient's report): No Awareness, Dental Status Current problems with teeth and/or dentures?: No Does patient usually wear dentures?: No  CIWA:  CIWA-Ar Total: 2 COWS:     Treatment Plan Summary: Daily contact with patient to assess and evaluate symptoms and progress in treatment and Medication management Supportive approach/coping skills Depression; will continue the Paxil at 60 mg and Lithium and Abilify augmentation Mood fluctuations of bipolar type; will assess further. Will get Lithium to therapeutic levels Alcohol dependence; will continue to work a relapse prevention plan CBT/mindfulness/work on self esteem self image Medical Decision Making:  Review of Psycho-Social Stressors (1), Review of Medication Regimen & Side Effects (2) and Review of New Medication or Change in  Dosage (2)     Ritu Gagliardo A 02/17/2015, 7:25 PM

## 2015-02-17 NOTE — [facility] Group Notes (Signed)
[facility] Group Notes:  (Nursing/MHT/Case Management/Adjunct)  Date:  02/17/2015  Time:  0900  Type of Therapy:  Nurse Education  Participation Level:  Did Not Attend  Participation Quality:    Affect:    Cognitive:    Insight:    Engagement in Group:    Modes of Intervention:    Summary of Progress/Problems: Patient was invited by this Clinical research associatewriter however elected to remain in bed asleep.  Merian CapronFriedman, Avelina Mcclurkin [facility] 02/17/2015, [phone]

## 2015-02-17 NOTE — [facility] Group Notes (Signed)
[facility] LCSW Group Therapy  02/17/2015 10:00 AM  Type of Therapy:  Group Therapy  Participation Level:  Did Not Attend despite staff encouragement     Carney Bernatherine C Gabryelle Whitmoyer, LCSW

## 2015-02-18 NOTE — [facility] Group Notes (Signed)
   [facility]  02/18/2015  1:15 PM   Participation Quality: Alert, Appropriate and Oriented  Mood/Affect: Appropriate  Depression Rating: Did not rate  Anxiety Rating: Did not rate  Thoughts of Suicide: Pt denies SI/HI  Will you contract for safety? Yes  Current AVH: Pt denies  Plan for Discharge/Comments: Pt attended discharge planning group and actively participated in group. CSW provided pt with today's workbook. Patient reports planning to return home with a referral for another psychiatrist in the [place] area or wanting to go into a long term treatment program.  Transportation Means: Pt reports access to transportation  Supports: No supports mentioned at this time  Samuella BruinKristin Giovan Pinsky, MSW, [employer]CSWA Clinical Social Worker [facility] [phone]

## 2015-02-18 NOTE — Progress Notes (Signed)
NSG shift assessment. 7a-7p.   D: Affect blunted, mood depressed, behavior appropriate. Attends groups and participates. Spends time in the Day Room playing card games with other patients. Cooperative with staff and is getting along well with peers.   A: Observed pt interacting in group and in the milieu: Support and encouragement offered. Safety maintained with observations every 15 minutes.   R:   Contracts for safety and continues to follow the treatment plan, working on learning new coping skills.

## 2015-02-18 NOTE — Plan of Care (Signed)
Problem: Alteration in mood Goal: LTG-Patient reports reduction in suicidal thoughts (Patient reports reduction in suicidal thoughts and is able to verbalize a safety plan for whenever patient is feeling suicidal)  Outcome: Progressing Pt denies suicidal ideation this shift, appears brighter.  Interacting well with peers and playing cards after group.

## 2015-02-18 NOTE — Progress Notes (Signed)
Patient attended AA Speaker group tonight.  

## 2015-02-18 NOTE — Progress Notes (Signed)
[facility] MD Progress Note  02/18/2015 7:51 PM Jonathan [name]  MRN:  [id number] Subjective:  Jonathan [name] continues to work on getting his life back together. He knows that although he was able to quit his alcohol use 3 weeks ago "on his own" he has to have a relapse prevention plan in place. He admits that the depression took over his life. He hopes that the medications he is taking will help the depression get better. He states he still think he has "some bipolar" going on Principal Problem: Severe recurrent major depression without psychotic features Diagnosis:   Patient Active Problem List   Diagnosis Date Noted  . Severe recurrent major depression without psychotic features [F33.2] 02/16/2015  . Alcohol use disorder, severe, in early remission, dependence [F10.21] 02/16/2015  . Abnormal LFTs [R79.89] 02/07/2015  . AA (alcohol abuse) [F10.10] 02/07/2015  . Allergic rhinitis [J30.9] 02/07/2015  . 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 Acid reflux [K21.9] 02/07/2015  . Difficulty hearing [H91.90] 02/07/2015  . HLD (hyperlipidemia) [E78.5] 02/07/2015  . Recurrent major depression-severe [F33.2] 02/07/2015  . Alcohol abuse [F10.10] 02/07/2015  . Panic disorder [F41.0] 02/07/2015   Total Time spent with patient: 30 minutes   Past Medical History:  Past Medical History  Diagnosis Date  . Liver disease   . Gastric reflux   . Panic disorder   . Anxiety   . Depression     Past Surgical History  Procedure Laterality Date  . Colon surgery    . Ear surgey     Family History:  Family History  Problem Relation Age of Onset  . Diabetes Mother   . Depression Mother   . Anxiety disorder Mother   . COPD Mother   . Heart attack Father   . Diabetes Father    Social History:  History  Alcohol Use  . 8.4 oz/week  . 12 Cans of beer, 2 Shots of liquor, 0 Standard drinks or equivalent per week    Comment: trying to quit last about 2 weeks ago     History  Drug Use No    History   Social  History  . Marital Status: Married    Spouse Name: N/A  . Number of Children: N/A  . Years of Education: N/A   Social History Main Topics  . Smoking status: Current Every Day Smoker -- 1.00 packs/day    Types: Cigarettes    Start date: 02/07/2000  . Smokeless tobacco: Current User    Types: Snuff, Chew  . Alcohol Use: 8.4 oz/week    12 Cans of beer, 2 Shots of liquor, 0 Standard drinks or equivalent per week     Comment: trying to quit last about 2 weeks ago  . Drug Use: No  . Sexual Activity: Yes    Birth Control/ Protection: None   Other Topics Concern  . None   Social History Narrative   Additional History:    Sleep: Fair  Appetite:  Fair   Assessment:   Musculoskeletal: Strength & Muscle Tone: within normal limits Gait & Station: normal Patient leans: normal   Psychiatric Specialty Exam: Physical Exam  Review of Systems  Constitutional: Negative.   HENT: Positive for hearing loss.   Eyes: Negative.   Respiratory: Negative.   Cardiovascular: Negative.   Gastrointestinal: Negative.   Genitourinary: Negative.   Skin: Negative.   Neurological: Negative.   Endo/Heme/Allergies: Negative.   Psychiatric/Behavioral: Positive for depression and substance abuse. The patient is nervous/anxious.     Blood  pressure 116/68, pulse 108, temperature 97.4 F (36.3 C), temperature source Oral, resp. rate 18, height 6' (1.829 m), weight 97.07 kg (214 lb).Body mass index is 29.02 kg/(m^2).  General Appearance: Fairly Groomed  Patent attorney::  Fair  Speech:  Clear and Coherent  Volume:  Normal  Mood:  Anxious, Depressed and worried  Affect:  anxious worried  Thought Process:  Coherent and Goal Directed  Orientation:  Full (Time, Place, and Person)  Thought Content:  symptoms events worries concerns  Suicidal Thoughts:  No  Homicidal Thoughts:  No  Memory:  Immediate;   Fair Recent;   Fair Remote;   Fair  Judgement:  Fair  Insight:  Present and Shallow  Psychomotor  Activity:  Restlessness  Concentration:  Fair  Recall:  [employer] of Knowledge:Fair  Language: Fair  Akathisia:  No  Handed:  Right  AIMS (if indicated):     Assets:  Desire for Improvement  ADL's:  Intact  Cognition: WNL  Sleep:  Number of Hours: 6.5     Current Medications: Current Facility-Administered Medications  Medication Dose Route Frequency Provider Last Rate Last Dose  . acamprosate (CAMPRAL) tablet 666 mg  666 mg Oral TID WC Rachael Fee, MD   666 mg at 02/18/15 1804  . alum & mag hydroxide-simeth (MAALOX/MYLANTA) 200-200-20 MG/5ML suspension 30 mL  30 mL Oral Q4H PRN Worthy Flank, NP   30 mL at 02/18/15 1050  . ARIPiprazole (ABILIFY) tablet 2 mg  2 mg Oral Daily Rachael Fee, MD   2 mg at 02/18/15 0806  . diazepam (VALIUM) tablet 5 mg  5 mg Oral BID PRN Rachael Fee, MD   5 mg at 02/18/15 1050  . diphenhydrAMINE (BENADRYL) capsule 25 mg  25 mg Oral QHS PRN Rachael Fee, MD   25 mg at 02/17/15 2148  . doxycycline (VIBRA-TABS) tablet 100 mg  100 mg Oral BID Beau Fanny, FNP   100 mg at 02/18/15 1804  . feeding supplement (ENSURE ENLIVE) (ENSURE ENLIVE) liquid 237 mL  237 mL Oral BID BM Rachael Fee, MD   237 mL at 02/18/15 1442  . lithium carbonate (LITHOBID) CR tablet 600 mg  600 mg Oral QHS Worthy Flank, NP   600 mg at 02/17/15 2147  . magnesium hydroxide (MILK OF MAGNESIA) suspension 30 mL  30 mL Oral Daily PRN Worthy Flank, NP      . nicotine (NICODERM CQ - dosed in mg/24 hours) patch 21 mg  21 mg Transdermal Daily Rachael Fee, MD   21 mg at 02/18/15 0809  . pantoprazole (PROTONIX) EC tablet 40 mg  40 mg Oral Daily Worthy Flank, NP   40 mg at 02/18/15 0806  . PARoxetine (PAXIL) tablet 60 mg  60 mg Oral Daily Worthy Flank, NP   60 mg at 02/18/15 0806  . rosuvastatin (CRESTOR) tablet 10 mg  10 mg Oral q1800 Beau Fanny, FNP   10 mg at 02/18/15 1805  . traZODone (DESYREL) tablet 50 mg  50 mg Oral QHS PRN Rachael Fee, MD   50 mg at 02/17/15 2148     Lab Results: No results found for this or any previous visit (from the past 48 hour(s)).  Physical Findings: AIMS: Facial and Oral Movements Muscles of Facial Expression: None, normal Lips and Perioral Area: None, normal Jaw: None, normal Tongue: None, normal,Extremity Movements Upper (arms, wrists, hands, fingers): None, normal Lower (legs, knees, ankles,  toes): None, normal, Trunk Movements Neck, shoulders, hips: None, normal, Overall Severity Severity of abnormal movements (highest score from questions above): None, normal Incapacitation due to abnormal movements: None, normal Patient's awareness of abnormal movements (rate only patient's report): No Awareness, Dental Status Current problems with teeth and/or dentures?: No Does patient usually wear dentures?: No  CIWA:  CIWA-Ar Total: 4 COWS:     Treatment Plan Summary: Daily contact with patient to assess and evaluate symptoms and progress in treatment and Medication management Supportive approach/coping skills Alcohol dependence; continue to work a relapse prevention plan Major depression; continue to work with the Paxil at 60 mg the Abilify 2 mg with plans to increase to 5 mg Mood instability; will continue the Lithium at 600 mg daily and get a lithium level in AM Use CBT/mindfulness  Medical Decision Making:  Review of Psycho-Social Stressors (1), Review or order clinical lab tests (1) and Review of Medication Regimen & Side Effects (2)     Kailen Name A 02/18/2015, 7:51 PM

## 2015-02-18 NOTE — Progress Notes (Signed)
Patient in day room interacting with peers at the beginning of the shift. He reported that, he is doing well, attending groups and getting along with peers. He denied SI/HI and denied Hallucinations. Writer encouraged and supported patient. Q 15 minute check continues as ordered  For pain.

## 2015-02-19 LAB — LITHIUM LEVEL: Lithium Lvl: 0.61 mmol/L (ref 0.60–1.20)

## 2015-02-19 MED ORDER — TRAZODONE HCL 100 MG PO TABS
100.0000 mg | ORAL_TABLET | Freq: Every evening | ORAL | Status: DC | PRN
Start: 2015-02-19 — End: 2015-02-20
  Administered 2015-02-19: 100 mg via ORAL
  Filled 2015-02-19: qty 1

## 2015-02-19 MED ORDER — ARIPIPRAZOLE 5 MG PO TABS
5.0000 mg | ORAL_TABLET | Freq: Every day | ORAL | Status: DC
  Administered 2015-02-20 – 2015-02-22 (×3): 5 mg via ORAL
  Filled 2015-02-19 (×2): qty 1
  Filled 2015-02-19 (×2): qty 3
  Filled 2015-02-19 (×2): qty 1

## 2015-02-19 NOTE — [facility] Group Notes (Signed)
[facility] 02/19/2015 1:15pm  Type of Therapy: Mental Health Association Presentation  Participation Level: Active  Participation Quality: Attentive  Affect: Appropriate  Cognitive: Oriented  Insight: Developing/Improving  Engagement in Therapy: Engaged  Modes of Intervention: Discussion, Education and Socialization  Summary of Progress/Problems: Mental Health Association (MHA) Speaker came to talk about his personal journey with substance abuse and addiction. The pt processed ways by which to relate to the speaker. MHA speaker provided handouts and educational information pertaining to groups and services offered by [facility]. Pt was engaged in speaker's presentation and was receptive to resources provided.    Chad CordialLauren Carter, LCSWA 02/19/2015 1:31 PM

## 2015-02-19 NOTE — Progress Notes (Signed)
D:  Per pt self inventory pt reports sleeping fair, appetite good, energy level normal, ability to pay attention good, rates depression at a 3 out of 10, hopelessness at a 2 out of 10, anxiety at a 4 out of 10, denies SI/HI/AVH, denies any withdrawal symptoms, goal today: "Depression-mental health, go to group meetings"     A:  Emotional support provided, Encouraged pt to continue with treatment plan and attend all group activities, q15 min checks maintained for safety.  R:  Pt is receptive, visible in milieu, interacts appropriately with staff and other patients, pt's mood is bright today, he has been playing cards and laughing with others, only complaint was some anxiety early on this morning, pt received prn anxiety medication and is no feeling better.

## 2015-02-19 NOTE — [facility] Group Notes (Signed)
Adult Psychoeducational Group Note  Date:  02/19/2015 Time:  0850am  Group Topic/Focus:  Goals Group:   The focus of this group is to help patients establish daily goals to achieve during treatment and discuss how the patient can incorporate goal setting into their daily lives to aide in recovery. Orientation:   The focus of this group is to educate the patient on the purpose and policies of crisis stabilization and provide a format to answer questions about their admission.  The group details unit policies and expectations of patients while admitted.  Participation Level:  Active  Participation Quality:  Appropriate and Attentive  Affect:  Appropriate  Cognitive:  Alert and Appropriate  Insight: Appropriate  Engagement in Group:  Engaged  Modes of Intervention:  Discussion, Education and Orientation  Lauris Poagndrea B Lovenia Debruler 02/19/2015, 2:39 PM

## 2015-02-19 NOTE — Tx Team (Signed)
Interdisciplinary Treatment Plan Update (Adult)  Date:  02/19/2015 Time Reviewed:  6:47 PM  Progress in Treatment: Attending groups: Yes. Participating in groups:  Yes. Taking medication as prescribed:  Yes. Tolerating medication:  Yes. Family/Significant othe contact made:  No,CSW will contact collaterals w patient permission Patient understands diagnosis:  Yes. Discussing patient identified problems/goals with staff:  Yes. Medical problems stabilized or resolved:  Yes. Denies suicidal/homicidal ideation: Yes. Issues/concerns per patient self-inventory:  No. Other:  New problem(s) identified: No, Describe:  wants referral to new therapist, CSW will assist  Discharge Plan or Barriers:  Continued medication stabilization, depressive sxd  Reason for Continuation of Hospitalization: Depression Withdrawal symptoms Depressive sx  Comments: hearing impaired  Estimated length of stay:  3 - 5 days  New goal(s):  Arrange aftercare as appropriate  Review of initial/current patient goals per problem list:  See plan of care   Attendees: Patient:   7/5/20166:47 PM  Family:   7/5/20166:47 PM  Physician:  Jola BaptistI Lugo, MD 7/5/20166:47 PM  Nursing:   Sue LushAndrea, RN 7/5/20166:47 PM  Case Manager:  Santa GeneraAnne Cunningham, LCSW 7/5/20166:47 PM  Counselor:   7/5/20166:47 PM  Other:  Vonna DraftsV Enoch, MonarchTCT 7/5/20166:47 PM  Other:  Sondra BargesJ Clark, RN UR 7/5/20166:47 PM  Other:   7/5/20166:47 PM  Other:  7/5/20166:47 PM  Other:  7/5/20166:47 PM  Other:  7/5/20166:47 PM  Other:  7/5/20166:47 PM  Other:  7/5/20166:47 PM  Other:  7/5/20166:47 PM  Other:   7/5/20166:47 PM   Scribe for Treatment Team:   Sallee Langeunningham, Anne C, 02/19/2015, 6:47 PM

## 2015-02-19 NOTE — Plan of Care (Signed)
Problem: Ineffective individual coping Goal: STG: Pt will be able to identify effective and ineffective STG: Pt will be able to identify effective and ineffective coping patterns  Outcome: Progressing Jonathan [name] is going to groups, participating well and showing good insight, today he talked about how coping will be "on the outside" once he is discharged and that he plans on changing who he hangs out with to more positive people.

## 2015-02-19 NOTE — Progress Notes (Signed)
[facility] Group Notes:  (Nursing/MHT/Case Management/Adjunct)  Date:  02/19/2015  Time:  11:16 PM  Type of Therapy:  Psychoeducational Skills  Participation Level:  Active  Participation Quality:  Appropriate  Affect:  Appropriate  Cognitive:  Appropriate  Insight:  Appropriate  Engagement in Group:  Engaged  Modes of Intervention:  Discussion  Summary of Progress/Problems: Tonight in wrap up group Fayrene FearingJames said that today was a 10 for him he has "a better sense of hope for the new JC" something positive for him today was he woke up Madaline Savageiamond N Caycee Wanat 02/19/2015, 11:16 PM

## 2015-02-19 NOTE — Progress Notes (Signed)
[facility] MD Progress Note  02/19/2015 8:04 PM Jonathan [name]  MRN:  [id number] Subjective:  Jonathan [name] endorses that he is trying to get his life back together. States that the depression is somewhat better. His lithium level is 0.61 ( WNL) he is anticipating the stressors he is going to face once he is D/C. He is planning to go back to his job, he will be with his GF and not interact too much with his ex wife as states she usually gives him a hard time and triggers him. He states he needs to take care of court and get on with his life.  Principal Problem: Severe recurrent major depression without psychotic features Diagnosis:   Patient Active Problem List   Diagnosis Date Noted  . Severe recurrent major depression without psychotic features [F33.2] 02/16/2015  . Alcohol use disorder, severe, in early remission, dependence [F10.21] 02/16/2015  . Abnormal LFTs [R79.89] 02/07/2015  . AA (alcohol abuse) [F10.10] 02/07/2015  . Allergic rhinitis [J30.9] 02/07/2015  . 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 Acid reflux [K21.9] 02/07/2015  . Difficulty hearing [H91.90] 02/07/2015  . HLD (hyperlipidemia) [E78.5] 02/07/2015  . Recurrent major depression-severe [F33.2] 02/07/2015  . Alcohol abuse [F10.10] 02/07/2015  . Panic disorder [F41.0] 02/07/2015   Total Time spent with patient: 30 minutes   Past Medical History:  Past Medical History  Diagnosis Date  . Liver disease   . Gastric reflux   . Panic disorder   . Anxiety   . Depression     Past Surgical History  Procedure Laterality Date  . Colon surgery    . Ear surgey     Family History:  Family History  Problem Relation Age of Onset  . Diabetes Mother   . Depression Mother   . Anxiety disorder Mother   . COPD Mother   . Heart attack Father   . Diabetes Father    Social History:  History  Alcohol Use  . 8.4 oz/week  . 12 Cans of beer, 2 Shots of liquor, 0 Standard drinks or equivalent per week    Comment: trying to quit last  about 2 weeks ago     History  Drug Use No    History   Social History  . Marital Status: Married    Spouse Name: N/A  . Number of Children: N/A  . Years of Education: N/A   Social History Main Topics  . Smoking status: Current Every Day Smoker -- 1.00 packs/day    Types: Cigarettes    Start date: 02/07/2000  . Smokeless tobacco: Current User    Types: Snuff, Chew  . Alcohol Use: 8.4 oz/week    12 Cans of beer, 2 Shots of liquor, 0 Standard drinks or equivalent per week     Comment: trying to quit last about 2 weeks ago  . Drug Use: No  . Sexual Activity: Yes    Birth Control/ Protection: None   Other Topics Concern  . None   Social History Narrative   Additional History:    Sleep: Fair  Appetite:  Fair   Assessment:   Musculoskeletal: Strength & Muscle Tone: within normal limits Gait & Station: normal Patient leans: normal   Psychiatric Specialty Exam: Physical Exam  Review of Systems  Constitutional: Negative.   HENT: Negative.   Eyes: Negative.   Respiratory: Negative.   Cardiovascular: Negative.   Gastrointestinal: Negative.   Genitourinary: Negative.   Musculoskeletal: Negative.   Skin: Negative.   Neurological: Negative.  Endo/Heme/Allergies: Negative.   Psychiatric/Behavioral: Positive for depression and substance abuse. The patient is nervous/anxious.     Blood pressure 117/78, pulse 102, temperature 98 F (36.7 C), temperature source Oral, resp. rate 16, height 6' (1.829 m), weight 97.07 kg (214 lb).Body mass index is 29.02 kg/(m^2).  General Appearance: Fairly Groomed  Patent attorneyye Contact::  Fair  Speech:  Clear and Coherent  Volume:  fluctuates  Mood:  Anxious and worried  Affect:  Labile  Thought Process:  Coherent and Goal Directed  Orientation:  Full (Time, Place, and Person)  Thought Content:  symptoms events worries concerns  Suicidal Thoughts:  No  Homicidal Thoughts:  No  Memory:  Immediate;   Fair Recent;   Fair Remote;   Fair   Judgement:  Fair  Insight:  Present  Psychomotor Activity:  Restlessness  Concentration:  Fair  Recall:  [employer]  Fund of Knowledge:Fair  Language: Fair  Akathisia:  No  Handed:  Right  AIMS (if indicated):     Assets:  Desire for Improvement  ADL's:  Intact  Cognition: WNL  Sleep:  Number of Hours: 6.5     Current Medications: Current Facility-Administered Medications  Medication Dose Route Frequency Provider Last Rate Last Dose  . acamprosate (CAMPRAL) tablet 666 mg  666 mg Oral TID WC Rachael FeeIrving A Criag Wicklund, MD   666 mg at 02/19/15 1810  . alum & mag hydroxide-simeth (MAALOX/MYLANTA) 200-200-20 MG/5ML suspension 30 mL  30 mL Oral Q4H PRN Worthy FlankIjeoma E Nwaeze, NP   30 mL at 02/18/15 1050  . [START ON 02/20/2015] ARIPiprazole (ABILIFY) tablet 5 mg  5 mg Oral Daily Rachael FeeIrving A Elbie Statzer, MD      . diazepam (VALIUM) tablet 5 mg  5 mg Oral BID PRN Rachael FeeIrving A Saharah Sherrow, MD   5 mg at 02/19/15 0759  . diphenhydrAMINE (BENADRYL) capsule 25 mg  25 mg Oral QHS PRN Rachael FeeIrving A Shamarie Call, MD   25 mg at 02/18/15 2121  . doxycycline (VIBRA-TABS) tablet 100 mg  100 mg Oral BID Beau FannyJohn C Withrow, FNP   100 mg at 02/19/15 1811  . feeding supplement (ENSURE ENLIVE) (ENSURE ENLIVE) liquid 237 mL  237 mL Oral BID BM Rachael FeeIrving A Lilie Vezina, MD   237 mL at 02/19/15 1430  . lithium carbonate (LITHOBID) CR tablet 600 mg  600 mg Oral QHS Worthy FlankIjeoma E Nwaeze, NP   600 mg at 02/18/15 2121  . magnesium hydroxide (MILK OF MAGNESIA) suspension 30 mL  30 mL Oral Daily PRN Worthy FlankIjeoma E Nwaeze, NP      . nicotine (NICODERM CQ - dosed in mg/24 hours) patch 21 mg  21 mg Transdermal Daily Rachael FeeIrving A Parag Dorton, MD   21 mg at 02/19/15 0755  . pantoprazole (PROTONIX) EC tablet 40 mg  40 mg Oral Daily Worthy FlankIjeoma E Nwaeze, NP   40 mg at 02/19/15 0756  . PARoxetine (PAXIL) tablet 60 mg  60 mg Oral Daily Worthy FlankIjeoma E Nwaeze, NP   60 mg at 02/19/15 0756  . rosuvastatin (CRESTOR) tablet 10 mg  10 mg Oral q1800 Beau FannyJohn C Withrow, FNP   10 mg at 02/19/15 1810  . traZODone (DESYREL) tablet 100 mg  100 mg  Oral QHS PRN Rachael FeeIrving A Crayton Savarese, MD        Lab Results:  Results for orders placed or performed during the hospital encounter of 02/16/15 (from the past 48 hour(s))  Lithium level     Status: None   Collection Time: 02/19/15  6:19 AM  Result Value Ref  Range   Lithium Lvl 0.61 0.60 - 1.20 mmol/L    Comment: Performed at [facility]    Physical Findings: AIMS: Facial and Oral Movements Muscles of Facial Expression: None, normal Lips and Perioral Area: None, normal Jaw: None, normal Tongue: None, normal,Extremity Movements Upper (arms, wrists, hands, fingers): None, normal Lower (legs, knees, ankles, toes): None, normal, Trunk Movements Neck, shoulders, hips: None, normal, Overall Severity Severity of abnormal movements (highest score from questions above): None, normal Incapacitation due to abnormal movements: None, normal Patient's awareness of abnormal movements (rate only patient's report): No Awareness, Dental Status Current problems with teeth and/or dentures?: No Does patient usually wear dentures?: No  CIWA:  CIWA-Ar Total: 4 COWS:     Treatment Plan Summary: Daily contact with patient to assess and evaluate symptoms and progress in treatment and Medication management Supportive approach/coping skills Alcohol dependence; continue to work a relapse prevention plan Depression; continue the Paxil at 60 mg and increase the Abilify to 5 mg  Mood instability; will continue the Lithium at 600 mg daily CBT/mindfulness  Medical Decision Making:  Review of Psycho-Social Stressors (1), Review of Medication Regimen & Side Effects (2) and Review of New Medication or Change in Dosage (2)     Raseel Jans A 02/19/2015, 8:04 PM

## 2015-02-19 NOTE — Progress Notes (Signed)
D: Pt says "I've felt the best in the last two days than I've in a long time." He denies SI/HI/AVH and pain. He has been seen interacting positively with peers. No complaints verbalized. A: Q15 safety checks maintained. Meds given as ordered. Support/encouragement provided. R: Pt remains safe and proceeds with treatment. Will continue to monitor for needs/safety.

## 2015-02-19 NOTE — Progress Notes (Signed)
Recreation Therapy Notes  Animal-Assisted Activity (AAA) Program Checklist/Progress Notes Patient Eligibility Criteria Checklist & Daily Group note for Rec Tx Intervention  Date: 07.05.16 Time: 230 pm Location: 400 Hall Dayroom   AAA/T Program Assumption of Risk Form signed by Patient/ or Parent Legal Guardian yes  Patient is free of allergies or sever asthma yes  Patient reports no fear of animals yes  Patient reports no history of cruelty to animalsyes  Patient understands his/her participation is voluntary yes  Patient washes hands before animal contact yes  Patient washes hands after animal contact yes  Education: Hand Washing, Appropriate Animal Interaction   Education Outcome: Acknowledges understanding/In group clarification offered/Needs additional education.   Clinical Observations/Feedback: Did not attend group.   Jonathan [name], LRT/CTRS         Jonathan [name] 02/19/2015 4:39 PM 

## 2015-02-20 LAB — RMSF, IGG, IFA: RMSF, IGG, IFA: 1:64 {titer} — ABNORMAL HIGH

## 2015-02-20 LAB — ROCKY MTN SPOTTED FVR ABS PNL(IGG+IGM)
RMSF IgG: POSITIVE — AB
RMSF IgM: 0.31 index (ref 0.00–0.89)

## 2015-02-20 MED ORDER — MIRTAZAPINE 7.5 MG PO TABS
7.5000 mg | ORAL_TABLET | Freq: Every day | ORAL | Status: DC
  Administered 2015-02-20: 7.5 mg via ORAL
  Filled 2015-02-20 (×3): qty 1

## 2015-02-20 MED ORDER — ZOLPIDEM TARTRATE 10 MG PO TABS
10.0000 mg | ORAL_TABLET | Freq: Every evening | ORAL | Status: DC | PRN
  Administered 2015-02-21: 10 mg via ORAL
  Filled 2015-02-20: qty 1

## 2015-02-20 NOTE — [facility] Group Notes (Signed)
[facility] Note  02/20/2015 8:45 AM  Participation Quality: Alert, Appropriate and Oriented  Mood/Affect: Appropriate  Depression Rating: 0  Anxiety Rating: 0  Thoughts of Suicide: Pt denies SI/HI  Will you contract for safety? Yes  Current AVH: Pt denies  Plan for Discharge/Comments: Pt attended discharge planning group and actively participated in group. CSW discussed suicide prevention education with the group and encouraged them to discuss discharge planning and any relevant barriers. Pt requests to speak to CSW one-on-one regarding discharge plan. Pt reports improved mood and stable housing to return to.   Transportation Means: Pt reports access to transportation  Supports: No supports mentioned at this time  Chad CordialLauren Carter, LCSWA 02/20/2015 9:57 AM

## 2015-02-20 NOTE — Progress Notes (Signed)
RN 2300 Shift  D: Pt in bed resting with eyes closed. Respirations even and unlabored. Pt appears to be in no signs of distress at this time. A: Q15min checks remains for this pt. R: Pt remains safe at this time.   

## 2015-02-20 NOTE — Progress Notes (Signed)
Recreation Therapy Notes  Date: 07.06.16 Time: 930 am Location: [address] Group Room  Group Topic: Stress Management  Goal Area(s) Addresses:  Patient will verbalize importance of using healthy stress management.  Patient will identify positive emotions associated with healthy stress management.   Intervention: Stress Management  Activity :  Guided Imagery.  LRT introduced and educated patients on the stress management technique of guided imagery.  A script was used to deliver the technique to patients.  Patients were asked to follow the script read a loud by LRT to engage in practicing the stress management technique.  Education:  Stress Management, Discharge Planning.   Education Outcome: Acknowledges edcuation/In group clarification offered/Needs additional education  Clinical Observations/Feedback: Did not attend group.    Reyden Smith, LRT/CTRS         Marjorie Lussier A 02/20/2015 3:44 PM 

## 2015-02-20 NOTE — Progress Notes (Signed)
Patient ID: Jonathan [name], male   DOB: Dec 17, 1980, 34 y.o.   MRN: [id number] D: Patient denies SI/HI and auditory and visual hallucinations. States he is not depressed. Patient very engaged with peers in games all day. Affect brighter as the day progressed. Patient very positive, States that anger is an issue for him and that he has trouble controlling his anger. Complained of heartburn.  A: Patient given emotional support from RN. Patient given medications per MD orders.  Maalox given for heartburn.Patient encouraged to attend groups and unit activities. Patient encouraged to come to staff with any questions or concerns.  R: Patient remains cooperative and appropriate. Will continue to monitor patient for safety.

## 2015-02-20 NOTE — [facility] Group Notes (Signed)
[facility] LCSW Group Therapy 02/20/2015 1:15 PM  Type of Therapy: Group Therapy- Emotion Regulation  Participation Level: Active   Participation Quality:  Appropriate  Affect: Appropriate  Cognitive: Alert and Oriented   Insight:  Developing/Improving  Engagement in Therapy: Developing/Improving and Engaged   Modes of Intervention: Clarification, Confrontation, Discussion, Education, Exploration, Limit-setting, Orientation, Problem-solving, Rapport Building, Dance movement psychotherapisteality Testing, Socialization and Support  Summary of Progress/Problems: The topic for group today was emotional regulation. This group focused on both positive and negative emotion identification and allowed group members to process ways to identify feelings, regulate negative emotions, and find healthy ways to manage internal/external emotions. Group members were asked to reflect on a time when their reaction to an emotion led to a negative outcome and explored how alternative responses using emotion regulation would have benefited them. Group members were also asked to discuss a time when emotion regulation was utilized when a negative emotion was experienced. Pt continues to be active in group discussions, however at times his comments are not relevant to therapeutic discussion. Pt reports that he feels anger is a difficult emotion to regulate. Pt expressed that he has a supportive family that can help him more effectively regulate emotions.   Chad CordialLauren Carter, LCSWA 02/20/2015 4:31 PM

## 2015-02-20 NOTE — Progress Notes (Signed)
[facility] MD Progress Note  02/20/2015 8:29 PM Jonathan [name]  MRN:  [id number] Subjective:  Jonathan [name] did not sleep well last night. Worried as insomnia is one of his triggers. States that he wants to really do well. He is wanting to pursue the relationship with his GF, be part of his child's life. Wants to eventually go back to school. Afraid of relapsing as he knows he is going to mess up what he already has going on for himself Principal Problem: Severe recurrent major depression without psychotic features Diagnosis:   Patient Active Problem List   Diagnosis Date Noted  . Severe recurrent major depression without psychotic features [F33.2] 02/16/2015  . Alcohol use disorder, severe, in early remission, dependence [F10.21] 02/16/2015  . Abnormal LFTs [R79.89] 02/07/2015  . AA (alcohol abuse) [F10.10] 02/07/2015  . Allergic rhinitis [J30.9] 02/07/2015  . 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 Acid reflux [K21.9] 02/07/2015  . Difficulty hearing [H91.90] 02/07/2015  . HLD (hyperlipidemia) [E78.5] 02/07/2015  . Recurrent major depression-severe [F33.2] 02/07/2015  . Alcohol abuse [F10.10] 02/07/2015  . Panic disorder [F41.0] 02/07/2015   Total Time spent with patient: 30 minutes   Past Medical History:  Past Medical History  Diagnosis Date  . Liver disease   . Gastric reflux   . Panic disorder   . Anxiety   . Depression     Past Surgical History  Procedure Laterality Date  . Colon surgery    . Ear surgey     Family History:  Family History  Problem Relation Age of Onset  . Diabetes Mother   . Depression Mother   . Anxiety disorder Mother   . COPD Mother   . Heart attack Father   . Diabetes Father    Social History:  History  Alcohol Use  . 8.4 oz/week  . 12 Cans of beer, 2 Shots of liquor, 0 Standard drinks or equivalent per week    Comment: trying to quit last about 2 weeks ago     History  Drug Use No    History   Social History  . Marital Status: Married   Spouse Name: N/A  . Number of Children: N/A  . Years of Education: N/A   Social History Main Topics  . Smoking status: Current Every Day Smoker -- 1.00 packs/day    Types: Cigarettes    Start date: 02/07/2000  . Smokeless tobacco: Current User    Types: Snuff, Chew  . Alcohol Use: 8.4 oz/week    12 Cans of beer, 2 Shots of liquor, 0 Standard drinks or equivalent per week     Comment: trying to quit last about 2 weeks ago  . Drug Use: No  . Sexual Activity: Yes    Birth Control/ Protection: None   Other Topics Concern  . None   Social History Narrative   Additional History:    Sleep: Poor  Appetite:  Fair   Assessment:   Musculoskeletal: Strength & Muscle Tone: within normal limits Gait & Station: normal Patient leans: normal   Psychiatric Specialty Exam: Physical Exam  Review of Systems  Constitutional: Negative.   HENT: Negative.   Eyes: Negative.   Respiratory: Negative.   Cardiovascular: Negative.   Gastrointestinal: Negative.   Genitourinary: Negative.   Musculoskeletal: Negative.   Skin: Negative.   Neurological: Negative.   Endo/Heme/Allergies: Negative.   Psychiatric/Behavioral: Positive for substance abuse. The patient is nervous/anxious and has insomnia.     Blood pressure 110/72, pulse 89, temperature 97.9  F (36.6 C), temperature source Oral, resp. rate 16, height 6' (1.829 m), weight 97.07 kg (214 lb).Body mass index is 29.02 kg/(m^2).  General Appearance: Fairly Groomed  Patent attorneyye Contact::  Fair  Speech:  Clear and Coherent  Volume:  fluctuates  Mood:  Anxious and worried  Affect:  anxious worried  Thought Process:  Coherent and Goal Directed  Orientation:  Full (Time, Place, and Person)  Thought Content:  symptoms events worries concerns  Suicidal Thoughts:  No  Homicidal Thoughts:  No  Memory:  Immediate;   Fair Recent;   Fair Remote;   Fair  Judgement:  Fair  Insight:  Present  Psychomotor Activity:  Restlessness  Concentration:  Fair   Recall:  [employer]Fair  Fund of Knowledge:Fair  Language: Fair  Akathisia:  No  Handed:  Right  AIMS (if indicated):     Assets:  Desire for Improvement Housing Social Support  ADL's:  Intact  Cognition: WNL  Sleep:  Number of Hours: 6.5     Current Medications: Current Facility-Administered Medications  Medication Dose Route Frequency Provider Last Rate Last Dose  . acamprosate (CAMPRAL) tablet 666 mg  666 mg Oral TID WC Rachael FeeIrving A Amarachukwu Lakatos, MD   666 mg at 02/20/15 1713  . alum & mag hydroxide-simeth (MAALOX/MYLANTA) 200-200-20 MG/5ML suspension 30 mL  30 mL Oral Q4H PRN Worthy FlankIjeoma E Nwaeze, NP   30 mL at 02/18/15 1050  . ARIPiprazole (ABILIFY) tablet 5 mg  5 mg Oral Daily Rachael FeeIrving A Yousaf Sainato, MD   5 mg at 02/20/15 0803  . diazepam (VALIUM) tablet 5 mg  5 mg Oral BID PRN Rachael FeeIrving A Zamorah Ailes, MD   5 mg at 02/20/15 1951  . diphenhydrAMINE (BENADRYL) capsule 25 mg  25 mg Oral QHS PRN Rachael FeeIrving A Jonise Weightman, MD   25 mg at 02/19/15 2236  . doxycycline (VIBRA-TABS) tablet 100 mg  100 mg Oral BID Beau FannyJohn C Withrow, FNP   100 mg at 02/20/15 1713  . feeding supplement (ENSURE ENLIVE) (ENSURE ENLIVE) liquid 237 mL  237 mL Oral BID BM Rachael FeeIrving A Doaa Kendzierski, MD   237 mL at 02/20/15 1026  . lithium carbonate (LITHOBID) CR tablet 600 mg  600 mg Oral QHS Worthy FlankIjeoma E Nwaeze, NP   600 mg at 02/19/15 2236  . magnesium hydroxide (MILK OF MAGNESIA) suspension 30 mL  30 mL Oral Daily PRN Worthy FlankIjeoma E Nwaeze, NP      . mirtazapine (REMERON) tablet 7.5 mg  7.5 mg Oral QHS Rachael FeeIrving A Daniell Paradise, MD      . nicotine (NICODERM CQ - dosed in mg/24 hours) patch 21 mg  21 mg Transdermal Daily Rachael FeeIrving A Lashunda Greis, MD   21 mg at 02/20/15 0809  . pantoprazole (PROTONIX) EC tablet 40 mg  40 mg Oral Daily Worthy FlankIjeoma E Nwaeze, NP   40 mg at 02/20/15 0804  . PARoxetine (PAXIL) tablet 60 mg  60 mg Oral Daily Worthy FlankIjeoma E Nwaeze, NP   60 mg at 02/20/15 0804  . rosuvastatin (CRESTOR) tablet 10 mg  10 mg Oral q1800 Beau FannyJohn C Withrow, FNP   10 mg at 02/20/15 1713    Lab Results:  Results for orders  placed or performed during the hospital encounter of 02/16/15 (from the past 48 hour(s))  Lithium level     Status: None   Collection Time: 02/19/15  6:19 AM  Result Value Ref Range   Lithium Lvl 0.61 0.60 - 1.20 mmol/L    Comment: Performed [facility]  Physical Findings: AIMS: Facial and Oral Movements Muscles of Facial Expression: None, normal Lips and Perioral Area: None, normal Jaw: None, normal Tongue: None, normal,Extremity Movements Upper (arms, wrists, hands, fingers): None, normal Lower (legs, knees, ankles, toes): None, normal, Trunk Movements Neck, shoulders, hips: None, normal, Overall Severity Severity of abnormal movements (highest score from questions above): None, normal Incapacitation due to abnormal movements: None, normal Patient's awareness of abnormal movements (rate only patient's report): No Awareness, Dental Status Current problems with teeth and/or dentures?: No Does patient usually wear dentures?: No  CIWA:  CIWA-Ar Total: 4 COWS:     Treatment Plan Summary: Daily contact with patient to assess and evaluate symptoms and progress in treatment and Medication management Supportive approach/coping skills Alcohol dependence; continue to work a relapse prevention plan Major depression; continue the Paxil 60/Abilify 5 Mood instability; continue the Lithobid 600 mg daily Insomnia; will use the Remeron 7.5 mg HS Will have Ambien as a back up plan Medical Decision Making:  Review of Psycho-Social Stressors (1), Review of Medication Regimen & Side Effects (2) and Review of New Medication or Change in Dosage (2)     Valeta Paz A 02/20/2015, 8:29 PM

## 2015-02-21 MED ORDER — MIRTAZAPINE 15 MG PO TABS
15.0000 mg | ORAL_TABLET | Freq: Every day | ORAL | Status: DC
  Administered 2015-02-21: 15 mg via ORAL
  Filled 2015-02-21: qty 1
  Filled 2015-02-21: qty 3
  Filled 2015-02-21: qty 1
  Filled 2015-02-21: qty 3
  Filled 2015-02-21: qty 1

## 2015-02-21 NOTE — Progress Notes (Signed)
Pt reports he is feeling better than when he was admitted.  His only concern is that he has not been able to sleep through the night.  Pt denies SI/HI/AVH at this time.  He had some med changes today to hopefully get a better sleep while still on the unit.  Pt plans to discharge home after his treatment.  He wants to go back to school so that he can support his child, and he also wants to improve his relationship with his child's mother.  Medications were reviewed with patient.  He attended evening group tonight.  He has spent most of the evening in the dayroom playing cards with peers.  He makes his needs known to staff.  Support and encouragement offered.  Safety maintained with q15 minute checks.

## 2015-02-21 NOTE — [facility] Group Notes (Signed)
[facility] Group Notes:  (Nursing/MHT/Case Management/Adjunct)  Date:  02/21/2015  Time:  0900am  Type of Therapy:  Nurse Education  Participation Level:  Active  Participation Quality:  Appropriate and Attentive  Affect:  Appropriate  Cognitive:  Alert and Appropriate  Insight:  Appropriate and Good  Engagement in Group:  Engaged  Modes of Intervention:  Discussion and Education, suppport  Summary of Progress/Problems: Patient attended group, remained engaged and responded appropriately when prompted.  Lendell CapriceGuthrie, Nethaniel Mattie A 02/21/2015, 10:41 AM

## 2015-02-21 NOTE — Progress Notes (Signed)
Pt attended karaoke group. Pt was engaged and got up and sang a song with his peers.

## 2015-02-21 NOTE — [facility] Group Notes (Signed)
[facility] LCSW Group Therapy 02/21/2015  1:15 pm   Type of Therapy: Group Therapy Participation Level: Active  Participation Quality:  Sharing  Affect: Depressed  Cognitive: Alert and Oriented  Insight: Developing  Engagement in Therapy: Developing; Intrusive yet redirectable  Modes of Intervention:  Discussion, Education, Limit-setting, Orientation, Problem-solving, Reality Testing, Socialization and support  Summary of Progress/Problems: The topic for group was balance in life. Today's group focused on defining balance in one's own words, identifying things that can knock one off balance, and exploring healthy ways to maintain balance in life. Group members were asked to provide an example of a time when they felt off balance, describe how they handled that situation,and process healthier ways to regain balance in the future. Group members were asked to share the most important tool for maintaining balance that they [facility] and how they plan to apply this method after discharge. Fayrene FearingJames was intrusive at multiple points with inappropriate,  sarcastic and tangential comments thus needed frequent redirection. Patient reports use of substances to deal with stressors will be his challenge and reports support from onthers is lacking in his life balance   Carney Bernatherine C Jerilee Space, LCSW

## 2015-02-21 NOTE — Progress Notes (Signed)
[facility] MD Progress Note  02/21/2015 11:31 PM Jonathan [name]  MRN:  [id number] Subjective:  Did sleep better last night. He is worried as he anticipates being D/C. Worried about the outcome of the court case. Wants to get his life back together. States that he has discovered things about himself that he did not know. He states for the first time in a long time he is hopeful Principal Problem: Severe recurrent major depression without psychotic features Diagnosis:   Patient Active Problem List   Diagnosis Date Noted  . Severe recurrent major depression without psychotic features [F33.2] 02/16/2015  . Alcohol use disorder, severe, in early remission, dependence [F10.21] 02/16/2015  . Abnormal LFTs [R79.89] 02/07/2015  . AA (alcohol abuse) [F10.10] 02/07/2015  . Allergic rhinitis [J30.9] 02/07/2015  . 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 Acid reflux [K21.9] 02/07/2015  . Difficulty hearing [H91.90] 02/07/2015  . HLD (hyperlipidemia) [E78.5] 02/07/2015  . Recurrent major depression-severe [F33.2] 02/07/2015  . Alcohol abuse [F10.10] 02/07/2015  . Panic disorder [F41.0] 02/07/2015   Total Time spent with patient: 30 minutes   Past Medical History:  Past Medical History  Diagnosis Date  . Liver disease   . Gastric reflux   . Panic disorder   . Anxiety   . Depression     Past Surgical History  Procedure Laterality Date  . Colon surgery    . Ear surgey     Family History:  Family History  Problem Relation Age of Onset  . Diabetes Mother   . Depression Mother   . Anxiety disorder Mother   . COPD Mother   . Heart attack Father   . Diabetes Father    Social History:  History  Alcohol Use  . 8.4 oz/week  . 12 Cans of beer, 2 Shots of liquor, 0 Standard drinks or equivalent per week    Comment: trying to quit last about 2 weeks ago     History  Drug Use No    History   Social History  . Marital Status: Married    Spouse Name: N/A  . Number of Children: N/A  .  Years of Education: N/A   Social History Main Topics  . Smoking status: Current Every Day Smoker -- 1.00 packs/day    Types: Cigarettes    Start date: 02/07/2000  . Smokeless tobacco: Current User    Types: Snuff, Chew  . Alcohol Use: 8.4 oz/week    12 Cans of beer, 2 Shots of liquor, 0 Standard drinks or equivalent per week     Comment: trying to quit last about 2 weeks ago  . Drug Use: No  . Sexual Activity: Yes    Birth Control/ Protection: None   Other Topics Concern  . None   Social History Narrative   Additional History:    Sleep: Fair  Appetite:  Fair   Assessment:   Musculoskeletal: Strength & Muscle Tone: within normal limits Gait & Station: normal Patient leans: normal   Psychiatric Specialty Exam: Physical Exam  Review of Systems  Constitutional: Negative.   HENT: Negative.   Eyes: Negative.   Respiratory: Negative.   Cardiovascular: Negative.   Gastrointestinal: Negative.   Genitourinary: Negative.   Musculoskeletal: Negative.   Skin: Negative.   Neurological: Negative.   Endo/Heme/Allergies: Negative.   Psychiatric/Behavioral: Positive for substance abuse. The patient is nervous/anxious.     Blood pressure 122/79, pulse 101, temperature 97.7 F (36.5 C), temperature source Oral, resp. rate 18, height 6' (1.829 m),  weight 97.07 kg (214 lb).Body mass index is 29.02 kg/(m^2).  General Appearance: Fairly Groomed  Patent attorneyye Contact::  Fair  Speech:  Clear and Coherent  Volume:  fluctuates  Mood:  Anxious and worried  Affect:  anxious worried  Thought Process:  Coherent and Goal Directed  Orientation:  Full (Time, Place, and Person)  Thought Content:   events worries concerns  Suicidal Thoughts:  No  Homicidal Thoughts:  No  Memory:  Immediate;   Fair Recent;   Fair Remote;   Fair  Judgement:  Fair  Insight:  Present  Psychomotor Activity:  Restlessness  Concentration:  Fair  Recall:  [employer]Fair  Fund of Knowledge:Fair  Language: Fair  Akathisia:   No  Handed:  Right  AIMS (if indicated):     Assets:  Desire for Improvement  ADL's:  Intact  Cognition: WNL  Sleep:  Number of Hours: 5.75     Current Medications: Current Facility-Administered Medications  Medication Dose Route Frequency Provider Last Rate Last Dose  . acamprosate (CAMPRAL) tablet 666 mg  666 mg Oral TID WC Rachael FeeIrving A Dijon Kohlman, MD   666 mg at 02/21/15 1640  . alum & mag hydroxide-simeth (MAALOX/MYLANTA) 200-200-20 MG/5ML suspension 30 mL  30 mL Oral Q4H PRN Worthy FlankIjeoma E Nwaeze, NP   30 mL at 02/18/15 1050  . ARIPiprazole (ABILIFY) tablet 5 mg  5 mg Oral Daily Rachael FeeIrving A Mazell Aylesworth, MD   5 mg at 02/21/15 0819  . diazepam (VALIUM) tablet 5 mg  5 mg Oral BID PRN Rachael FeeIrving A Evanthia Maund, MD   5 mg at 02/21/15 2234  . diphenhydrAMINE (BENADRYL) capsule 25 mg  25 mg Oral QHS PRN Rachael FeeIrving A Selassie Spatafore, MD   25 mg at 02/20/15 2234  . doxycycline (VIBRA-TABS) tablet 100 mg  100 mg Oral BID Beau FannyJohn C Withrow, FNP   100 mg at 02/21/15 1640  . feeding supplement (ENSURE ENLIVE) (ENSURE ENLIVE) liquid 237 mL  237 mL Oral BID BM Rachael FeeIrving A Shatoya Roets, MD   237 mL at 02/21/15 1641  . lithium carbonate (LITHOBID) CR tablet 600 mg  600 mg Oral QHS Worthy FlankIjeoma E Nwaeze, NP   600 mg at 02/21/15 2233  . magnesium hydroxide (MILK OF MAGNESIA) suspension 30 mL  30 mL Oral Daily PRN Worthy FlankIjeoma E Nwaeze, NP      . mirtazapine (REMERON) tablet 15 mg  15 mg Oral QHS Rachael FeeIrving A Dauna Ziska, MD   15 mg at 02/21/15 2233  . nicotine (NICODERM CQ - dosed in mg/24 hours) patch 21 mg  21 mg Transdermal Daily Rachael FeeIrving A Karter Haire, MD   21 mg at 02/21/15 [id number]  . pantoprazole (PROTONIX) EC tablet 40 mg  40 mg Oral Daily Worthy FlankIjeoma E Nwaeze, NP   40 mg at 02/21/15 0819  . PARoxetine (PAXIL) tablet 60 mg  60 mg Oral Daily Worthy FlankIjeoma E Nwaeze, NP   60 mg at 02/21/15 0819  . rosuvastatin (CRESTOR) tablet 10 mg  10 mg Oral q1800 Beau FannyJohn C Withrow, FNP   10 mg at 02/21/15 1804  . zolpidem (AMBIEN) tablet 10 mg  10 mg Oral QHS PRN Rachael FeeIrving A Jeri Jeanbaptiste, MD   10 mg at 02/21/15 0006    Lab Results: No  results found for this or any previous visit (from the past 48 hour(s)).  Physical Findings: AIMS: Facial and Oral Movements Muscles of Facial Expression: None, normal Lips and Perioral Area: None, normal Jaw: None, normal Tongue: None, normal,Extremity Movements Upper (arms, wrists, hands, fingers): None, normal Lower (legs, knees,  ankles, toes): None, normal, Trunk Movements Neck, shoulders, hips: None, normal, Overall Severity Severity of abnormal movements (highest score from questions above): None, normal Incapacitation due to abnormal movements: None, normal Patient's awareness of abnormal movements (rate only patient's report): No Awareness, Dental Status Current problems with teeth and/or dentures?: No Does patient usually wear dentures?: No  CIWA:  CIWA-Ar Total: 4 COWS:     Treatment Plan Summary: Daily contact with patient to assess and evaluate symptoms and progress in treatment and Medication management Supportive approach/coping skills Depression; continue the Paxil 60 mg with Abilify augmentation Mood instability; continue the lithium 600 mg daily Alcohol dependence; continue the Campral and the Relapse prevention plan Insomnia; did sleep with the Ambien, will increase the Remeron to see if it helps him without having to use the Ambien  Medical Decision Making:  Review of Psycho-Social Stressors (1), Review of Medication Regimen & Side Effects (2) and Review of New Medication or Change in Dosage (2)     Bradford Cazier A 02/21/2015, 11:31 PM

## 2015-02-21 NOTE — Progress Notes (Signed)
D: Patient is alert and oriented. Pt's mood and affect is pleasant/euthymic and appropriate to circumstance. Pt denies SI/HI and AVH. Pt is tachycardic upon standing, denies symptoms. Pt rates depression "1or2/10", hopelessness 0/10 and anxiety 1/10. Pt reports his goal for the day is "leaving here and having a good plan to follow through on to stay on the road to recovery." Pt reports sleep disturbance last night but reports he slept well after taking PRN Ambien. Pt C/O anxiety today which decreased with PRN medication. Pt is attending unit groups. A: Active listening by RN. Encouragement/Support provided to pt. Medication education reviewed with pt. PRN medication administered for anxiety per providers orders (See MAR). Scheduled medications administered per providers orders (See MAR). 15 minute checks continued per protocol for patient safety.  R: Patient cooperative and receptive to nursing interventions. Pt remains safe.

## 2015-02-21 NOTE — Plan of Care (Signed)
Problem: Ineffective individual coping Goal: STG: Patient will remain free from self harm Outcome: Progressing Patient remains free from self harm. 15 minute checks continued per protocol for patient safety.   Problem: Alteration in mood Goal: LTG-Patient reports reduction in suicidal thoughts (Patient reports reduction in suicidal thoughts and is able to verbalize a safety plan for whenever patient is feeling suicidal)  Outcome: Progressing Patient denies having suicidal thoughts today.  Problem: Diagnosis: Increased Risk For Suicide Attempt Goal: STG-Patient Will Attend All Groups On The Unit Outcome: Progressing Patient is attending unit groups today.  Problem: Alteration in mood & ability to function due to Goal: STG-Patient will comply with prescribed medication regimen (Patient will comply with prescribed medication regimen)  Outcome: Progressing Patient has adhered to medication regimen today with ease.

## 2015-02-22 MED ORDER — LITHIUM CARBONATE ER 300 MG PO TBCR: 600.0000 mg | EXTENDED_RELEASE_TABLET | Freq: Every day | ORAL | Status: DC

## 2015-02-22 MED ORDER — DEXLANSOPRAZOLE 60 MG PO CPDR: 60.0000 mg | DELAYED_RELEASE_CAPSULE | Freq: Every day | ORAL | Status: DC

## 2015-02-22 MED ORDER — ROSUVASTATIN CALCIUM 10 MG PO TABS: 10.0000 mg | ORAL_TABLET | Freq: Every day | ORAL | Status: DC

## 2015-02-22 MED ORDER — ARIPIPRAZOLE 5 MG PO TABS: 5.0000 mg | ORAL_TABLET | Freq: Every day | ORAL | Status: DC

## 2015-02-22 MED ORDER — MIRTAZAPINE 15 MG PO TABS: 15.0000 mg | ORAL_TABLET | Freq: Every day | ORAL | Status: DC

## 2015-02-22 MED ORDER — ACAMPROSATE CALCIUM 333 MG PO TBEC: 666.0000 mg | DELAYED_RELEASE_TABLET | Freq: Three times a day (TID) | ORAL | Status: DC

## 2015-02-22 MED ORDER — PAROXETINE HCL 30 MG PO TABS: 60.0000 mg | ORAL_TABLET | Freq: Every day | ORAL | Status: DC

## 2015-02-22 NOTE — [facility] Suicide Risk Assessment (Signed)
[facility] INPATIENT:  Family/Significant Other Suicide Prevention Education Late Entry Suicide Prevention Education:  Patient Refusal for Family/Significant Other Suicide Prevention Education: The patient Jonathan [name] has refused to provide written consent for family/significant other to be provided Family/Significant Other Suicide Prevention Education during admission and/or prior to discharge.  Physician notified.  Writer provided suicide prevention education directly to patient on 02/22/2015 at 2:20 PM; conversation included risk factors, warning signs and resources to contact for help. Mobile crisis services explained and  explanations given as to resources which will be included on patient's discharge paperwork.   Clide DalesHarrill, Catherine Campbell 02/22/2015, 3:43 PM

## 2015-02-22 NOTE — Tx Team (Signed)
Interdisciplinary Treatment Plan Update (Adult)  Date:  02/22/2015 Time Reviewed:  8:55 AM  Progress in Treatment: Attending groups: Yes. Participating in groups:  Yes. Taking medication as prescribed:  Yes. Tolerating medication:  Yes. Family/Significant othe contact made:  No,CSW will contact collaterals w patient permission Patient understands diagnosis:  Yes. Discussing patient identified problems/goals with staff:  Yes. Medical problems stabilized or resolved:  Yes. Denies suicidal/homicidal ideation: Yes. Issues/concerns per patient self-inventory:  No. Other:  New problem(s) identified: No, Describe:  wants referral to new therapist, CSW will assist  Discharge Plan or Barriers:  Continued medication stabilization, depressive sxd  Reason for Continuation of Hospitalization: Depression Withdrawal symptoms Depressive sx  Comments: hearing impaired 02/22/15:  [facility], has appt on record.    Estimated length of stay:  3 - 5 days  New goal(s):  Arrange aftercare as appropriate  Review of initial/current patient goals per problem list:  See plan of care   Attendees: Patient:   7/8/20168:55 AM  Family:   7/8/20168:55 AM  Physician:  Jola BaptistI Lugo, MD 7/8/20168:55 AM  Nursing:   Sue LushAndrea, RN 7/8/20168:55 AM  Case Manager:  Santa GeneraAnne Joydan Gretzinger, LCSW 7/8/20168:55 AM  Counselor:   7/8/20168:55 AM  Other:  Vonna DraftsV Enoch, [facility]arch[facility] 7/8/20168:55 AM  Other:  Sondra BargesJ Clark, RN UR 7/8/20168:55 AM  Other:   7/8/20168:55 AM  Other:  7/8/20168:55 AM  Other:  7/8/20168:55 AM  Other:  7/8/20168:55 AM  Other:  7/8/20168:55 AM  Other:  7/8/20168:55 AM  Other:  7/8/20168:55 AM  Other:   7/8/20168:55 AM   Scribe for Treatment Team:   Sallee Langeunningham, Lynette Topete C, 02/22/2015, 8:55 AM

## 2015-02-22 NOTE — Progress Notes (Signed)
D) pt. Was d/c to care of girlfriend.  Pt. Affect and mood appropriate, but reports some anxiety about discharging due to relationships he's made with peers.  Pt. Denied SI/HI and denied A/V hallucinations.  Denied pain.  Pt. Reports readiness for d/c, despite mild anxiety.  A) AVS reviewed.  Prescriptions reviewed and provided, samples given.  Safety plan reviewed.  Belongings returned.  R) Pt. Receptive and asked appropriate questions regarding d/c indicating understanding of material.  Escorted to lobby.

## 2015-02-22 NOTE — Discharge Summary (Signed)
Physician Discharge Summary Note  Patient:  Jonathan [name] is an 34 y.o., male MRN:  [id number] DOB:  01-11-1981 Patient phone:  [phone] (home)  Patient address:   [address] [place] [address],  Total Time spent with patient: 45 minutes  Date of Admission:  02/16/2015 Date of Discharge: 02/22/2015  Reason for Admission:  depression  Principal Problem: Severe recurrent major depression without psychotic features Discharge Diagnoses: Patient Active Problem List   Diagnosis Date Noted  . Severe recurrent major depression without psychotic features [F33.2] 02/16/2015  . Alcohol use disorder, severe, in early remission, dependence [F10.21] 02/16/2015  . Abnormal LFTs [R79.89] 02/07/2015  . AA (alcohol abuse) [F10.10] 02/07/2015  . Allergic rhinitis [J30.9] 02/07/2015  . 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 Acid reflux [K21.9] 02/07/2015  . Difficulty hearing [H91.90] 02/07/2015  . HLD (hyperlipidemia) [E78.5] 02/07/2015  . Recurrent major depression-severe [F33.2] 02/07/2015  . Alcohol abuse [F10.10] 02/07/2015  . Panic disorder [F41.0] 02/07/2015    Musculoskeletal: Strength & Muscle Tone: within normal limits Gait & Station: normal Patient leans: N/A  Psychiatric Specialty Exam:  SEE SRA Physical Exam  Vitals reviewed.   Review of Systems  All other systems reviewed and are negative.   Blood pressure 126/73, pulse 114, temperature 97.7 F (36.5 C), temperature source Oral, resp. rate 18, height 6' (1.829 m), weight 97.07 kg (214 lb).Body mass index is 29.02 kg/(m^2).  Have you used any form of tobacco in the last 30 days? (Cigarettes, Smokeless Tobacco, Cigars, and/or Pipes): Yes  Has this patient used any form of tobacco in the last 30 days? (Cigarettes, Smokeless Tobacco, Cigars, and/or Pipes) N/A  Past Medical History:  Past Medical History  Diagnosis Date  . Liver disease   . Gastric reflux   . Panic disorder   . Anxiety   . Depression      Past Surgical History  Procedure Laterality Date  . Colon surgery    . Ear surgey     Family History:  Family History  Problem Relation Age of Onset  . Diabetes Mother   . Depression Mother   . Anxiety disorder Mother   . COPD Mother   . Heart attack Father   . Diabetes Father    Social History:  History  Alcohol Use  . 8.4 oz/week  . 12 Cans of beer, 2 Shots of liquor, 0 Standard drinks or equivalent per week    Comment: trying to quit last about 2 weeks ago     History  Drug Use No    History   Social History  . Marital Status: Married    Spouse Name: N/A  . Number of Children: N/A  . Years of Education: N/A   Social History Main Topics  . Smoking status: Current Every Day Smoker -- 1.00 packs/day    Types: Cigarettes    Start date: 02/07/2000  . Smokeless tobacco: Current User    Types: Snuff, Chew  . Alcohol Use: 8.4 oz/week    12 Cans of beer, 2 Shots of liquor, 0 Standard drinks or equivalent per week     Comment: trying to quit last about 2 weeks ago  . Drug Use: No  . Sexual Activity: Yes    Birth Control/ Protection: None   Other Topics Concern  . None   Social History Narrative   Risk to Self: Is patient at risk for suicide?: Yes What has been your use of drugs/alcohol within the last 12 months?: Drinking  has been consistent the last 12 months. Patient stated he consumed alcohol everyday.  Risk to Others:   Prior Inpatient Therapy:   Prior Outpatient Therapy:    Level of Care:  OP  Hospital Course:  GREGOIRE BENNIS was admitted for Severe recurrent major depression without psychotic features and crisis management.  She was treated discharged with the medications listed below under Medication List.  Medical problems were identified and treated as needed.  Home medications were restarted as appropriate.  Improvement was monitored by observation and Nadara Mode daily report of symptom reduction.  Emotional and mental status was monitored by daily  self-inventory reports completed by Nadara Mode and clinical staff.         Nadara Mode was evaluated by the treatment team for stability and plans for continued recovery upon discharge.  Nadara Mode motivation was an integral factor for scheduling further treatment.  Employment, transportation, bed availability, health status, family support, and any pending legal issues were also considered during his hospital stay.  He was offered further treatment options upon discharge including but not limited to Residential, Intensive Outpatient, and Outpatient treatment.  Nadara Mode will follow up with the services as listed below under Follow Up Information.     Upon completion of this admission the patient was both mentally and medically stable for discharge denying suicidal/homicidal ideation, auditory/visual/tactile hallucinations, delusional thoughts and paranoia.      Consults:  psychiatry  Significant Diagnostic Studies:  labs: per ED  Discharge Vitals:   Blood pressure 126/73, pulse 114, temperature 97.7 F (36.5 C), temperature source Oral, resp. rate 18, height 6' (1.829 m), weight 97.07 kg (214 lb). Body mass index is 29.02 kg/(m^2). Lab Results:   No results found for this or any previous visit (from the past 72 hour(s)).  Physical Findings: AIMS: Facial and Oral Movements Muscles of Facial Expression: None, normal Lips and Perioral Area: None, normal Jaw: None, normal Tongue: None, normal,Extremity Movements Upper (arms, wrists, hands, fingers): None, normal Lower (legs, knees, ankles, toes): None, normal, Trunk Movements Neck, shoulders, hips: None, normal, Overall Severity Severity of abnormal movements (highest score from questions above): None, normal Incapacitation due to abnormal movements: None, normal Patient's awareness of abnormal movements (rate only patient's report): No Awareness, Dental Status Current problems with teeth and/or dentures?: No Does patient usually  wear dentures?: No  CIWA:  CIWA-Ar Total: 4 COWS:      See Psychiatric Specialty Exam and Suicide Risk Assessment completed by Attending Physician prior to discharge.  Discharge destination:  Home  Is patient on multiple antipsychotic therapies at discharge:  No   Has Patient had three or more failed trials of antipsychotic monotherapy by history:  No    Recommended Plan for Multiple Antipsychotic Therapies: NA     Medication List    STOP taking these medications        diazepam 10 MG tablet  Commonly known as:  VALIUM     diphenhydrAMINE 25 MG tablet  Commonly known as:  SOMINEX     FLAX SEED OIL PO      TAKE these medications      Indication   acamprosate 333 MG tablet  Commonly known as:  CAMPRAL  Take 2 tablets (666 mg total) by mouth 3 (three) times daily with meals.   Indication:  Excessive Use of Alcohol     ARIPiprazole 5 MG tablet  Commonly known as:  ABILIFY  Take 1 tablet (5 mg  total) by mouth daily.      dexlansoprazole 60 MG capsule  Commonly known as:  DEXILANT  Take 1 capsule (60 mg total) by mouth daily.   Indication:  Gastroesophageal Reflux Disease     lithium carbonate 300 MG CR tablet  Commonly known as:  LITHOBID  Take 2 tablets (600 mg total) by mouth at bedtime.   Indication:  Depression     mirtazapine 15 MG tablet  Commonly known as:  REMERON  Take 1 tablet (15 mg total) by mouth at bedtime.   Indication:  Trouble Sleeping, Major Depressive Disorder     PARoxetine 30 MG tablet  Commonly known as:  PAXIL  Take 2 tablets (60 mg total) by mouth daily.   Indication:  Major Depressive Disorder     rosuvastatin 10 MG tablet  Commonly known as:  CRESTOR  Take 1 tablet (10 mg total) by mouth daily.   Indication:  High Amount of Fats in the Blood           Follow-up Information    [facility] On 03/26/2015.   Why:  at 11:00am for your medication management appointment with Dr. Janeece RiggersSu. Please bring your  photo ID, insurance card, and medication list. If you should need to reschedule this appointment, please allow 24hr notice or you will not be able to be seen any longer   Contact information:   [address] [place], [place] [address]    [phone]    ([phone])  [phone]      Follow-up recommendations:  Activity:  as tol, diet as tol  Comments:  1.  Take all your medications as prescribed.              2.  Report any adverse side effects to outpatient provider.                       3.  Patient instructed to not use alcohol or illegal drugs while on prescription medicines.            4.  In the event of worsening symptoms, instructed patient to call 911, the crisis hotline or go to nearest emergency room for evaluation of symptoms.  Total Discharge Time:  40 min  Signed: Velna HatchetSheila May Agustin AGNP-BC 02/22/2015, 4:20 PM  I personally assessed the patient and formulated the plan Madie RenoIrving A. Dub MikesLugo, M.D.

## 2015-02-22 NOTE — Progress Notes (Signed)
  [facility] :  Will you be returning to the same living situation after discharge:  Yes,  pt's home At discharge, do you have transportation home?: Yes,  friend Do you have the ability to pay for your medications: Yes,  [facility] as per [facility] report  Release of information consent forms completed and in the chart;  Patient's signature needed at discharge.  Patient to Follow up at: Follow-up Information    [facility] with [facility] On 03/26/2015.   Why:  at 11:00am for your medication management appointment with Dr. Janeece RiggersSu. Please bring your photo ID, insurance card, and medication list. If you should need to reschedule this appointment, please allow 24hr notice or you will not be able to be seen any longer   Contact information:   [address] [place], [place] [address]    ([phone]) [phone]    [phone](f)  [phone]      Patient denies SI/HI: Yes,  denies both    Safety Planning and Suicide Prevention discussed: Yes,  with patient  Have you used any form of tobacco in the last 30 days? (Cigarettes, Smokeless Tobacco, Cigars, and/or Pipes): Yes  Has patient been referred to the Quitline?: Patient refused referral  Clide DalesHarrill, Catherine Campbell 02/22/2015, 11:12 AM

## 2015-02-22 NOTE — [facility] Suicide Risk Assessment (Signed)
[facility]   Demographic Factors:  Male  Total Time spent with patient: 30 minutes  Musculoskeletal: Strength & Muscle Tone: within normal limits Gait & Station: normal Patient leans: normal  Psychiatric Specialty Exam: Physical Exam  Review of Systems  Constitutional: Negative.   HENT: Negative.   Eyes: Negative.   Respiratory: Negative.   Cardiovascular: Negative.   Gastrointestinal: Negative.   Genitourinary: Negative.   Musculoskeletal: Negative.   Skin: Negative.   Neurological: Negative.   Endo/Heme/Allergies: Negative.   Psychiatric/Behavioral: Positive for substance abuse. The patient is nervous/anxious.     Blood pressure 126/73, pulse 114, temperature 97.7 F (36.5 C), temperature source Oral, resp. rate 18, height 6' (1.829 m), weight 97.07 kg (214 lb).Body mass index is 29.02 kg/(m^2).  General Appearance: Fairly Groomed  Patent attorneyye Contact::  Fair  Speech:  Clear and Coherent409  Volume:  Normal  Mood:  Euthymic  Affect:  Appropriate  Thought Process:  Coherent and Goal Directed  Orientation:  Full (Time, Place, and Person)  Thought Content:  plans as he moves on, relapse prevention plan  Suicidal Thoughts:  No  Homicidal Thoughts:  No  Memory:  Immediate;   Fair Recent;   Fair Remote;   Fair  Judgement:  Fair  Insight:  Present  Psychomotor Activity:  Restlessness  Concentration:  Fair  Recall:  FiservFair  Fund of Knowledge:Fair  Language: Fair  Akathisia:  No  Handed:  Right  AIMS (if indicated):     Assets:  Desire for Improvement Housing Social Support  Sleep:  Number of Hours: 5.5  Cognition: WNL  ADL's:  Intact   Have you used any form of tobacco in the last 30 days? (Cigarettes, Smokeless Tobacco, Cigars, and/or Pipes): Yes  Has this patient used any form of tobacco in the last 30 days? (Cigarettes, Smokeless Tobacco, Cigars, and/or Pipes) Yes, Prescription not provided because: patches were given   Mental Status Per  Nursing Assessment::   On Admission:     Current Mental Status by Physician: in full contact with reality. there are no active S/S of withdrawal. there are no active SI plans or intent he is committed to abstinence will continue to work a relapse prevention plan  Loss Factors: NA  Historical Factors: NA  Risk Reduction Factors:   Sense of responsibility to family, Employed, Living with another person, especially a relative and Positive social support  Continued Clinical Symptoms:  Depression:   Impulsivity Alcohol/Substance Abuse/Dependencies  Cognitive Features That Contribute To Risk:  Closed-mindedness, Polarized thinking and Thought constriction (tunnel vision)    Suicide Risk:  Minimal: No identifiable suicidal ideation.  Patients presenting with no risk factors but with morbid ruminations; may be classified as minimal risk based on the severity of the depressive symptoms  Principal Problem: Severe recurrent major depression without psychotic features Discharge Diagnoses:  Patient Active Problem List   Diagnosis Date Noted  . Severe recurrent major depression without psychotic features [F33.2] 02/16/2015  . Alcohol use disorder, severe, in early remission, dependence [F10.21] 02/16/2015  . Abnormal LFTs [R79.89] 02/07/2015  . AA (alcohol abuse) [F10.10] 02/07/2015  . Allergic rhinitis [J30.9] 02/07/2015  . 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 Acid reflux [K21.9] 02/07/2015  . Difficulty hearing [H91.90] 02/07/2015  . HLD (hyperlipidemia) [E78.5] 02/07/2015  . Recurrent major depression-severe [F33.2] 02/07/2015  . Alcohol abuse [F10.10] 02/07/2015  . Panic disorder [F41.0] 02/07/2015    Follow-up Information    [facility]/26/2015.  Why:  at 11:00am for your medication management appointment with Dr. Janeece Riggers. Please bring your photo ID, insurance card, and medication list. If you should need to reschedule this appointment, please  allow 24hr notice or you will not be able to be seen any longer   Contact information:   [address] [place], [place] [address]    [phone]    [phone]      Plan Of Care/Follow-up recommendations:  Activity:  as tolerated Diet:  regular Follow up as above Is patient on multiple antipsychotic therapies at discharge:  No   Has Patient had three or more failed trials of antipsychotic monotherapy by history:  No  Recommended Plan for Multiple Antipsychotic Therapies: NA    Kashana Breach A 02/22/2015, 1:46 PM

## 2015-02-22 NOTE — Progress Notes (Signed)
Pt reports he is being discharged on Friday.  He says he is ready to go home and start going to meetings.  He also says he determined to stay clean this time.  He says he has so much to live for.  He wants to be a good father to his child.  He denies SI/HI/AVH.  He is hopeful to be able to sleep tonight.  His Remeron dosage was increased to 15 mg for tonight.  He attended evening karaoke tonight and said he enjoyed himself.  Pt makes his needs known to staff.  Pt voiced no needs or concerns.  Support and encouragement offered.  Safety maintained with q15 minute checks.

## 2015-02-22 NOTE — Plan of Care (Signed)
Problem: Diagnosis: Increased Risk For Suicide Attempt Goal: LTG-Patient Will Report Improved Mood and Deny Suicidal LTG (by discharge) Patient will report improved mood and deny suicidal ideation.  Outcome: Completed/Met Date Met:  02/22/15 Pt reports the medications are working and he is feeling better that he has felt in a long time.  He denies having any suicidal thoughts at this time.

## 2015-02-22 NOTE — Plan of Care (Signed)
Problem: Alteration in mood & ability to function due to Goal: STG: Patient verbalizes decreases in signs of withdrawal Outcome: Completed/Met Date Met:  02/22/15 Pt reports he is no longer having any withdrawal symptoms.

## 2015-02-26 NOTE — Progress Notes (Signed)
Patient appears not to have followed up with outpatient lab tests. This will be reviewed in future visits

## 2015-06-01 ENCOUNTER — Other Ambulatory Visit: Payer: Self-pay | Admitting: Psychiatry

## 2015-10-02 ENCOUNTER — Emergency Department
Admission: EM | Admit: 2015-10-02 | Discharge: 2015-10-02 | Disposition: A | Discharge status: Home / Self Care | Payer: 59 | Attending: Emergency Medicine | Admitting: Emergency Medicine

## 2015-10-02 DIAGNOSIS — F410 Panic disorder [episodic paroxysmal anxiety] without agoraphobia: Secondary | ICD-10-CM | POA: Insufficient documentation

## 2015-10-02 DIAGNOSIS — F17210 Nicotine dependence, cigarettes, uncomplicated: Secondary | ICD-10-CM | POA: Insufficient documentation

## 2015-10-02 LAB — BASIC METABOLIC PANEL
Anion gap: 11 (ref 5–15)
BUN: 10 mg/dL (ref 6–20)
CO2: 22 mmol/L (ref 22–32)
Calcium: 9 mg/dL (ref 8.9–10.3)
Chloride: 102 mmol/L (ref 101–111)
Creatinine, Ser: 1.02 mg/dL (ref 0.61–1.24)
GFR calc Af Amer: >60 mL/min (ref >60)
GLUCOSE: 122 mg/dL — AB (ref 65–99)
POTASSIUM: 3.9 mmol/L (ref 3.5–5.1)
SODIUM: 135 mmol/L (ref 135–145)

## 2015-10-02 LAB — CBC
HCT: 46.9 % (ref 40.0–52.0)
HEMOGLOBIN: 16 g/dL (ref 13.0–18.0)
MCH: 29.4 pg (ref 26.0–34.0)
MCHC: 34 g/dL (ref 32.0–36.0)
MCV: 86.5 fL (ref 80.0–100.0)
PLATELETS: 308 10*3/uL (ref 150–440)
RBC: 5.43 MIL/uL (ref 4.40–5.90)
RDW: 13.7 % (ref 11.5–14.5)
WBC: 4.5 10*3/uL (ref 3.8–10.6)

## 2015-10-02 LAB — ETHANOL: Alcohol, Ethyl (B): 15 mg/dL — ABNORMAL HIGH (ref <5)

## 2015-10-02 LAB — TROPONIN I: Troponin I: <0.03 ng/mL (ref <0.031)

## 2015-10-02 NOTE — ED Notes (Signed)
Attempted to call pt to room x3 with no response.

## 2015-10-02 NOTE — ED Notes (Signed)
Pt called to go to treatment room, no response.

## 2015-10-02 NOTE — ED Notes (Signed)
Pt in with congestion for few days, states dizzy at work then started having chest pain.  Pt states has had same symptoms in the past frequent hx of panic attacks.

## 2018-01-24 ENCOUNTER — Encounter: Payer: Self-pay | Admitting: Emergency Medicine

## 2018-01-24 ENCOUNTER — Other Ambulatory Visit: Payer: Self-pay

## 2018-01-24 ENCOUNTER — Emergency Department
Admission: EM | Admit: 2018-01-24 | Discharge: 2018-01-26 | Disposition: A | Discharge status: Other Acute Inpatient Hospital | Payer: No Typology Code available for payment source | Attending: Emergency Medicine | Admitting: Emergency Medicine

## 2018-01-24 DIAGNOSIS — R45851 Suicidal ideations: Secondary | ICD-10-CM | POA: Insufficient documentation

## 2018-01-24 DIAGNOSIS — F419 Anxiety disorder, unspecified: Secondary | ICD-10-CM | POA: Insufficient documentation

## 2018-01-24 DIAGNOSIS — F17200 Nicotine dependence, unspecified, uncomplicated: Secondary | ICD-10-CM | POA: Diagnosis present

## 2018-01-24 DIAGNOSIS — F1020 Alcohol dependence, uncomplicated: Secondary | ICD-10-CM | POA: Diagnosis present

## 2018-01-24 DIAGNOSIS — Z79899 Other long term (current) drug therapy: Secondary | ICD-10-CM | POA: Insufficient documentation

## 2018-01-24 DIAGNOSIS — E785 Hyperlipidemia, unspecified: Secondary | ICD-10-CM | POA: Diagnosis present

## 2018-01-24 DIAGNOSIS — F17210 Nicotine dependence, cigarettes, uncomplicated: Secondary | ICD-10-CM | POA: Insufficient documentation

## 2018-01-24 DIAGNOSIS — R042 Hemoptysis: Secondary | ICD-10-CM | POA: Insufficient documentation

## 2018-01-24 DIAGNOSIS — F410 Panic disorder [episodic paroxysmal anxiety] without agoraphobia: Secondary | ICD-10-CM | POA: Diagnosis present

## 2018-01-24 DIAGNOSIS — Z046 Encounter for general psychiatric examination, requested by authority: Secondary | ICD-10-CM | POA: Insufficient documentation

## 2018-01-24 DIAGNOSIS — F329 Major depressive disorder, single episode, unspecified: Secondary | ICD-10-CM | POA: Insufficient documentation

## 2018-01-24 DIAGNOSIS — F10920 Alcohol use, unspecified with intoxication, uncomplicated: Secondary | ICD-10-CM | POA: Insufficient documentation

## 2018-01-24 DIAGNOSIS — Z9114 Patient's other noncompliance with medication regimen: Secondary | ICD-10-CM | POA: Insufficient documentation

## 2018-01-24 DIAGNOSIS — E782 Mixed hyperlipidemia: Secondary | ICD-10-CM | POA: Diagnosis present

## 2018-01-24 HISTORY — DX: Unspecified hearing loss, unspecified ear: H91.90

## 2018-01-24 NOTE — ED Triage Notes (Addendum)
[place] [employer] [employer] after it was reported that the pt attempted to harm himself by drinking bleach and sent multiple texts to his daughters mom that he wanted to kill himself. Pt denies. +ETOH. Pt denies recreational drug use.

## 2018-01-25 ENCOUNTER — Encounter: Payer: Self-pay | Admitting: Psychiatry

## 2018-01-25 ENCOUNTER — Emergency Department: Payer: 59

## 2018-01-25 DIAGNOSIS — F332 Major depressive disorder, recurrent severe without psychotic features: Secondary | ICD-10-CM | POA: Diagnosis not present

## 2018-01-25 DIAGNOSIS — F17200 Nicotine dependence, unspecified, uncomplicated: Secondary | ICD-10-CM | POA: Diagnosis present

## 2018-01-25 LAB — URINE DRUG SCREEN, QUALITATIVE (ARMC ONLY)
AMPHETAMINES, UR SCREEN: NOT DETECTED
BENZODIAZEPINE, UR SCRN: POSITIVE — AB
Barbiturates, Ur Screen: NOT DETECTED
Cannabinoid 50 Ng, Ur ~~LOC~~: POSITIVE — AB
Cocaine Metabolite,Ur ~~LOC~~: NOT DETECTED
MDMA (ECSTASY) UR SCREEN: NOT DETECTED
METHADONE SCREEN, URINE: NOT DETECTED
Opiate, Ur Screen: NOT DETECTED
PHENCYCLIDINE (PCP) UR S: NOT DETECTED
Tricyclic, Ur Screen: NOT DETECTED

## 2018-01-25 LAB — COMPREHENSIVE METABOLIC PANEL
ALK PHOS: 100 U/L (ref 38–126)
ALT: 25 U/L (ref 17–63)
AST: 22 U/L (ref 15–41)
Albumin: 4.9 g/dL (ref 3.5–5.0)
Anion gap: 14 (ref 5–15)
BILIRUBIN TOTAL: 1 mg/dL (ref 0.3–1.2)
BUN: 10 mg/dL (ref 6–20)
CO2: 24 mmol/L (ref 22–32)
Calcium: 9.8 mg/dL (ref 8.9–10.3)
Chloride: 101 mmol/L (ref 101–111)
Creatinine, Ser: 0.93 mg/dL (ref 0.61–1.24)
GFR calc non Af Amer: >60 mL/min (ref >60)
Glucose, Bld: 92 mg/dL (ref 65–99)
Potassium: 4.1 mmol/L (ref 3.5–5.1)
SODIUM: 139 mmol/L (ref 135–145)
Total Protein: 8.3 g/dL — ABNORMAL HIGH (ref 6.5–8.1)

## 2018-01-25 LAB — CBC WITH DIFFERENTIAL/PLATELET
BASOS PCT: 1 %
Basophils Absolute: 0.1 10*3/uL (ref 0–0.1)
EOS ABS: 0.2 10*3/uL (ref 0–0.7)
Eosinophils Relative: 2 %
HEMATOCRIT: 52.3 % — AB (ref 40.0–52.0)
HEMOGLOBIN: 18.3 g/dL — AB (ref 13.0–18.0)
LYMPHS ABS: 1.5 10*3/uL (ref 1.0–3.6)
Lymphocytes Relative: 17 %
MCH: 31.2 pg (ref 26.0–34.0)
MCHC: 35 g/dL (ref 32.0–36.0)
MCV: 89.2 fL (ref 80.0–100.0)
Monocytes Absolute: 0.7 10*3/uL (ref 0.2–1.0)
Monocytes Relative: 8 %
NEUTROS ABS: 6.6 10*3/uL — AB (ref 1.4–6.5)
NEUTROS PCT: 72 %
Platelets: 349 10*3/uL (ref 150–440)
RBC: 5.86 MIL/uL (ref 4.40–5.90)
RDW: 12.8 % (ref 11.5–14.5)
WBC: 9 10*3/uL (ref 3.8–10.6)

## 2018-01-25 LAB — ETHANOL: ALCOHOL ETHYL (B): 72 mg/dL — AB (ref <10)

## 2018-01-25 MED ORDER — HYDROXYZINE HCL 25 MG PO TABS
50.0000 mg | ORAL_TABLET | Freq: Once | ORAL | Status: AC
  Administered 2018-01-25: 50 mg via ORAL
  Filled 2018-01-25: qty 2

## 2018-01-25 NOTE — ED Notes (Signed)
Hourly rounding reveals patient in room. No complaints, stable, in no acute distress. Q15 minute rounds and monitoring via Security Cameras to continue.Snack and beverage given. 

## 2018-01-25 NOTE — Consult Note (Signed)
Will admit to psychiatry. Orders entered. Full note to follow. 

## 2018-01-25 NOTE — ED Notes (Signed)
Hourly rounding reveals patient sleeping in room. No complaints, stable, in no acute distress. Q15 minute rounds and monitoring via Security Cameras to continue. 

## 2018-01-25 NOTE — ED Notes (Signed)
PT  IVC  SEEN  BY  DR  Jennet MaduroPUCILOWSKA  MD PENDING  PLACEMENT

## 2018-01-25 NOTE — ED Notes (Signed)
Hourly rounding reveals patient in room. Stable, in no acute distress. Q15 minute rounds and monitoring via Security Cameras to continue. 

## 2018-01-25 NOTE — ED Notes (Signed)
Report to include Situation, Background, Assessment, and Recommendations received from Amy Teague RN. Patient alert and oriented, warm and dry, in no acute distress. Patient denies SI, HI, AVH and pain. Patient made aware of Q15 minute rounds and security cameras for their safety. Patient instructed to come to me with needs or concerns. 

## 2018-01-25 NOTE — ED Notes (Signed)
BEHAVIORAL HEALTH ROUNDING Patient sleeping: YES. Patient alert and oriented: yes Behavior appropriate: Yes.  ; If no, describe:  Nutrition and fluids offered: yes Toileting and hygiene offered: Yes  Sitter present: q15 minute observations and security monitoring Law enforcement present: Yes
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
## 2018-01-25 NOTE — ED Notes (Signed)
Hourly rounding reveals patient in room. Patient complains of anxiety, stable, in no acute distress. Q15 minute rounds and monitoring via [employer] to continue.

## 2018-01-25 NOTE — ED Notes (Signed)
Patient had a visit with mom for 7 minutes and then dad came to visit for 7 minutes
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
## 2018-01-25 NOTE — ED Notes (Signed)
Pt. Transferred to [facility] from ED to room 1 after screening for contraband. Report to include Situation, Background, Assessment and Recommendations from Kim Summers RN. Pt. Oriented to unit including Q15 minute rounds as well as the security cameras for their protection. Patient is alert and oriented, warm and dry in no acute distress. Patient denies SI, HI, and AVH. Pt. Encouraged to let me know if needs arise. 

## 2018-01-25 NOTE — Consult Note (Signed)
[facility] Consult   Reason for Consult:  Suicidal ideation Referring Physician:  Dr. Alfred Levins Patient Identification: Jonathan [name] MRN:  [id number] Principal Diagnosis: Severe recurrent major depression without psychotic features [facility]) Diagnosis:   Patient Active Problem List   Diagnosis Date Noted  . Tobacco use disorder [F17.200] 01/25/2018  . Severe recurrent major depression without psychotic features ([place]) [F33.2] 02/16/2015  . Alcohol use disorder, severe, dependence ([place]) [F10.20] 02/16/2015  . Abnormal LFTs [R94.5] 02/07/2015  . AA (alcohol abuse) [F10.10] 02/07/2015  . Allergic rhinitis [J30.9] 02/07/2015  . 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 Acid reflux [K21.9] 02/07/2015  . Difficulty hearing [H91.90] 02/07/2015  . HLD (hyperlipidemia) [E78.5] 02/07/2015  . Recurrent major depression-severe ([place]) [F33.2] 02/07/2015  . Alcohol abuse [F10.10] 02/07/2015  . Panic disorder [F41.0] 02/07/2015    Total Time spent with patient: 1 hour   Identifying data. Jonathan [name] is a 37 year old male with a history of depression and alcoholism.  Chief complaint. "Divorce and separation."  History of present illness. Information was obtained from the patient and the chart. The patient came to the ER complaining of suicidal ideation with a plan to overdose. His girlfriend of several years left him few days prior and his drinking escalated again. He reports poor sleep decread appetite, anhedonia, feeling of guit hopelessness worthlessness, poor energy and concentration, social isolation, crying spells and now suicidal ideation. His anxiety is high with panic attacks "all the time". He denies psychotic symptoms or mania. He does report profound mood swings and was diagnosed with bipolar disorder in the past. He has been taking Paxil 60 mg that "stopped working". He has been off Paxil for 3 days. He takes Valium for panic attacks. 30 tablets last him 3 months.  Past  psychiatric history. Long history of drinking and mood instability. Treated with Paxil, Remeron, Abilify, Lithium in the past. He was in rehab once and maintained sobriety for 3 months. He used to drink whiskey but now it is beer. He was hospitalized at [facility] few years back. He used to see Jonathan [name] in outpatient setting briefly.  Family psychiatric history. Mother with "depression, anxiety, bipolar on Xanax and Valium".  Social history. He is now finally divorced but unable to see his child. He has been in a relationship, that just ended, for several years. He is unemployed and unable to pay his rent and has been spending time with his parents.   Risk to Self: Suicidal Ideation: No-Not Currently/Within Last 6 Months Suicidal Intent: No Is patient at risk for suicide?: No, but patient needs Medical Clearance Suicidal Plan?: No Access to Means: No What has been your use of drugs/alcohol within the last 12 months?: alcohol, marijuana, benzodiazepines How many times?: 0 Other Self Harm Risks: SA Triggers for Past Attempts: None known Intentional Self Injurious Behavior: None Risk to Others: Homicidal Ideation: No Thoughts of Harm to Others: No Current Homicidal Intent: No Current Homicidal Plan: No Access to Homicidal Means: No Identified Victim: None identified History of harm to others?: Yes Assessment of Violence: In past 6-12 months Violent Behavior Description: Pt has assault charge pending Does patient have access to weapons?: No Criminal Charges Pending?: Yes Describe Pending Criminal Charges: Pt has assault charge pending Does patient have a court date: Yes Court Date: 01/26/18 Prior Inpatient Therapy: Prior Inpatient Therapy: No Prior Outpatient Therapy: Prior Outpatient Therapy: Yes Prior Therapy Dates: 2016 Prior Therapy Facilty/Provider(s): [facility] Reason for Treatment: depression Does patient have an ACCT team?: No Does  patient have Intensive In-House Services?  : No Does  patient have [facility] services? : No Does patient have P4CC services?: No  Past Medical History:  Past Medical History:  Diagnosis Date  . Anxiety   . Deaf   . Depression   . Gastric reflux   . Liver disease   . Panic disorder     Past Surgical History:  Procedure Laterality Date  . COLON SURGERY    . ear surgey     Family History:  Family History  Problem Relation Age of Onset  . Diabetes Mother   . Depression Mother   . Anxiety disorder Mother   . COPD Mother   . Heart attack Father   . Diabetes Father    Social History:  Social History   Substance and Sexual Activity  Alcohol Use Yes  . Alcohol/week: 8.4 oz  . Types: 12 Cans of beer, 2 Shots of liquor per week   Comment: trying to quit last about 2 weeks ago     Social History   Substance and Sexual Activity  Drug Use No    Social History   Socioeconomic History  . Marital status: Married    Spouse name: Not on file  . Number of children: Not on file  . Years of education: Not on file  . Highest education level: Not on file  Occupational History  . Not on file  Social Needs  . Financial resource strain: Not on file  . Food insecurity:    Worry: Not on file    Inability: Not on file  . Transportation needs:    Medical: Not on file    Non-medical: Not on file  Tobacco Use  . Smoking status: Current Every Day Smoker    Packs/day: 1.00    Types: Cigarettes    Start date: 02/07/2000  . Smokeless tobacco: Current User    Types: Snuff, Chew  Substance and Sexual Activity  . Alcohol use: Yes    Alcohol/week: 8.4 oz    Types: 12 Cans of beer, 2 Shots of liquor per week    Comment: trying to quit last about 2 weeks ago  . Drug use: No  . Sexual activity: Yes    Birth control/protection: None  Lifestyle  . Physical activity:    Days per week: Not on file    Minutes per session: Not on file  . Stress: Not on file  Relationships  . Social connections:    Talks on phone: Not on file    Gets  together: Not on file    Attends religious service: Not on file    Active member of club or organization: Not on file    Attends meetings of clubs or organizations: Not on file    Relationship status: Not on file  Other Topics Concern  . Not on file  Social History Narrative  . Not on file   Additional Social History:    Allergies:  No Known Allergies  Labs:  Results for orders placed or performed during the hospital encounter of 01/24/18 (from the past 48 hour(s))  Comprehensive metabolic panel     Status: Abnormal   Collection Time: 01/24/18 11:46 PM  Result Value Ref Range   Sodium 139 135 - 145 mmol/L   Potassium 4.1 3.5 - 5.1 mmol/L   Chloride 101 101 - 111 mmol/L   CO2 24 22 - 32 mmol/L   Glucose, Bld 92 65 - 99 mg/dL   BUN 10  6 - 20 mg/dL   Creatinine, Ser 0.93 0.61 - 1.24 mg/dL   Calcium 9.8 8.9 - 10.3 mg/dL   Total Protein 8.3 (H) 6.5 - 8.1 g/dL   Albumin 4.9 3.5 - 5.0 g/dL   AST 22 15 - 41 U/L   ALT 25 17 - 63 U/L   Alkaline Phosphatase 100 38 - 126 U/L   Total Bilirubin 1.0 0.3 - 1.2 mg/dL   GFR calc non Af Amer >60 >60 mL/min   GFR calc Af Amer >60 >60 mL/min    Comment: (NOTE) The eGFR has been calculated using the CKD EPI equation. This calculation has not been validated in all clinical situations. eGFR's persistently <60 mL/min signify possible Chronic Kidney Disease.    Anion gap 14 5 - 15    Comment: Performed at [facility], [address] [place], [place] [address]  Ethanol     Status: Abnormal   Collection Time: 01/24/18 11:46 PM  Result Value Ref Range   Alcohol, Ethyl (B) 72 (H) <10 mg/dL    Comment: (NOTE) Lowest detectable limit for serum alcohol is 10 mg/dL. For medical purposes only. Performed at [facility], [address] [place], [place] [address]   Urine Drug Screen, Qualitative     Status: Abnormal   Collection Time: 01/24/18 11:46 PM  Result Value Ref Range   Tricyclic, Ur Screen NONE DETECTED NONE  DETECTED   Amphetamines, Ur Screen NONE DETECTED NONE DETECTED   MDMA (Ecstasy)Ur Screen NONE DETECTED NONE DETECTED   Cocaine Metabolite,Ur [place] NONE DETECTED NONE DETECTED   Opiate, Ur Screen NONE DETECTED NONE DETECTED   Phencyclidine (PCP) Ur S NONE DETECTED NONE DETECTED   Cannabinoid 50 Ng, Ur [place] POSITIVE (A) NONE DETECTED   Barbiturates, Ur Screen NONE DETECTED NONE DETECTED   Benzodiazepine, Ur Scrn POSITIVE (A) NONE DETECTED   Methadone Scn, Ur NONE DETECTED NONE DETECTED    Comment: (NOTE) Tricyclics + metabolites, urine    Cutoff 1000 ng/mL Amphetamines + metabolites, urine  Cutoff 1000 ng/mL MDMA (Ecstasy), urine              Cutoff 500 ng/mL Cocaine Metabolite, urine          Cutoff 300 ng/mL Opiate + metabolites, urine        Cutoff 300 ng/mL Phencyclidine (PCP), urine         Cutoff 25 ng/mL Cannabinoid, urine                 Cutoff 50 ng/mL Barbiturates + metabolites, urine  Cutoff 200 ng/mL Benzodiazepine, urine              Cutoff 200 ng/mL Methadone, urine                   Cutoff 300 ng/mL The urine drug screen provides only a preliminary, unconfirmed analytical test result and should not be used for non-medical purposes. Clinical consideration and professional judgment should be applied to any positive drug screen result due to possible interfering substances. A more specific alternate chemical method must be used in order to obtain a confirmed analytical result. Gas chromatography / mass spectrometry (GC/MS) is the preferred confirmat ory method. Performed at [facility], [address] [place], [place] [address]   CBC with Diff     Status: Abnormal   Collection Time: 01/24/18 11:46 PM  Result Value Ref Range   WBC 9.0 3.8 - 10.6 K/uL   RBC 5.86 4.40 -  5.90 MIL/uL   Hemoglobin 18.3 (H) 13.0 - 18.0 g/dL   HCT 52.3 (H) 40.0 - 52.0 %   MCV 89.2 80.0 - 100.0 fL   MCH 31.2 26.0 - 34.0 pg   MCHC 35.0 32.0 - 36.0 g/dL   RDW 12.8 11.5 - 14.5 %    Platelets 349 150 - 440 K/uL   Neutrophils Relative % 72 %   Neutro Abs 6.6 (H) 1.4 - 6.5 K/uL   Lymphocytes Relative 17 %   Lymphs Abs 1.5 1.0 - 3.6 K/uL   Monocytes Relative 8 %   Monocytes Absolute 0.7 0.2 - 1.0 K/uL   Eosinophils Relative 2 %   Eosinophils Absolute 0.2 0 - 0.7 K/uL   Basophils Relative 1 %   Basophils Absolute 0.1 0 - 0.1 K/uL    Comment: Performed at [facility], [address] [place], [place] [address]    No current facility-administered medications for this encounter.    Current Outpatient Medications  Medication Sig Dispense Refill  . rosuvastatin (CRESTOR) 10 MG tablet Take 1 tablet (10 mg total) by mouth daily. 30 tablet 0  . ARIPiprazole (ABILIFY) 5 MG tablet Take 1 tablet (5 mg total) by mouth daily. (Patient not taking: Reported on 01/25/2018) 30 tablet 0  . lithium carbonate (LITHOBID) 300 MG CR tablet Take 2 tablets (600 mg total) by mouth at bedtime. (Patient not taking: Reported on 01/25/2018) 60 tablet 0  . PARoxetine (PAXIL) 30 MG tablet Take 2 tablets (60 mg total) by mouth daily. (Patient not taking: Reported on 01/25/2018) 60 tablet 0    Musculoskeletal: Strength & Muscle Tone: within normal limits Gait & Station: normal Patient leans: N/A  Psychiatric Specialty Exam: Physical Exam  Nursing note and vitals reviewed. Psychiatric: His speech is normal. His affect is blunt. He is slowed and withdrawn. Cognition and memory are normal. He expresses impulsivity. He exhibits a depressed mood. He expresses suicidal ideation. He expresses suicidal plans.    Review of Systems  HENT: Positive for hearing loss.   Neurological: Negative.   Psychiatric/Behavioral: Positive for depression, substance abuse and suicidal ideas. The patient has insomnia.   All other systems reviewed and are negative.   Blood pressure 136/80, pulse 80, temperature 98.5 F (36.9 C), temperature source Oral, resp. rate 18, height 6' (1.829 m), weight 99.8 kg (220  lb), SpO2 98 %.Body mass index is 29.84 kg/m.  General Appearance: Disheveled  Eye Contact:  Good  Speech:  Normal Rate and patient short of hearing with cochlear implants  Volume:  Normal  Mood:  Depressed, Dysphoric and Hopeless  Affect:  Tearful  Thought Process:  Goal Directed and Descriptions of Associations: Intact  Orientation:  Full (Time, Place, and Person)  Thought Content:  WDL  Suicidal Thoughts:  Yes.  with intent/plan  Homicidal Thoughts:  No  Memory:  Immediate;   Fair Recent;   Fair Remote;   Fair  Judgement:  Poor  Insight:  Lacking  Psychomotor Activity:  Psychomotor Retardation  Concentration:  Concentration: Fair and Attention Span: Fair  Recall:  [employer] of Knowledge:  Fair  Language:  Fair  Akathisia:  No  Handed:  Right  AIMS (if indicated):     Assets:  Communication Skills Desire for Improvement Housing Physical Health Resilience Social Support  ADL's:  Intact  Cognition:  WNL  Sleep:        Treatment Plan Summary: Daily contact with patient to assess and evaluate symptoms and progress in  treatment and Medication management   PLAN: Will admit to psychiatry Orders entered. Case discussed with Dr. Alfred Levins.    Disposition: Recommend psychiatric Inpatient admission when medically cleared. Supportive therapy provided about ongoing stressors.  Orson Slick, MD 01/25/2018 3:55 PM

## 2018-01-26 ENCOUNTER — Other Ambulatory Visit: Payer: Self-pay

## 2018-01-26 ENCOUNTER — Inpatient Hospital Stay
Admission: AD | Admit: 2018-01-26 | Discharge: 2018-01-28 | DRG: 885 | Disposition: A | Discharge status: Home / Self Care | Payer: No Typology Code available for payment source | Attending: Psychiatry | Admitting: Psychiatry

## 2018-01-26 DIAGNOSIS — G4700 Insomnia, unspecified: Secondary | ICD-10-CM | POA: Diagnosis present

## 2018-01-26 DIAGNOSIS — K769 Liver disease, unspecified: Secondary | ICD-10-CM | POA: Diagnosis present

## 2018-01-26 DIAGNOSIS — T43226A Underdosing of selective serotonin reuptake inhibitors, initial encounter: Secondary | ICD-10-CM | POA: Diagnosis present

## 2018-01-26 DIAGNOSIS — E782 Mixed hyperlipidemia: Secondary | ICD-10-CM | POA: Diagnosis present

## 2018-01-26 DIAGNOSIS — E785 Hyperlipidemia, unspecified: Secondary | ICD-10-CM | POA: Diagnosis present

## 2018-01-26 DIAGNOSIS — K219 Gastro-esophageal reflux disease without esophagitis: Secondary | ICD-10-CM | POA: Diagnosis present

## 2018-01-26 DIAGNOSIS — F1020 Alcohol dependence, uncomplicated: Secondary | ICD-10-CM | POA: Diagnosis present

## 2018-01-26 DIAGNOSIS — F17200 Nicotine dependence, unspecified, uncomplicated: Secondary | ICD-10-CM | POA: Diagnosis present

## 2018-01-26 DIAGNOSIS — F17210 Nicotine dependence, cigarettes, uncomplicated: Secondary | ICD-10-CM | POA: Diagnosis present

## 2018-01-26 DIAGNOSIS — Z635 Disruption of family by separation and divorce: Secondary | ICD-10-CM

## 2018-01-26 DIAGNOSIS — Z818 Family history of other mental and behavioral disorders: Secondary | ICD-10-CM | POA: Diagnosis not present

## 2018-01-26 DIAGNOSIS — H9190 Unspecified hearing loss, unspecified ear: Secondary | ICD-10-CM | POA: Diagnosis present

## 2018-01-26 DIAGNOSIS — R45851 Suicidal ideations: Secondary | ICD-10-CM | POA: Diagnosis present

## 2018-01-26 DIAGNOSIS — R945 Abnormal results of liver function studies: Secondary | ICD-10-CM | POA: Diagnosis present

## 2018-01-26 DIAGNOSIS — F1220 Cannabis dependence, uncomplicated: Secondary | ICD-10-CM | POA: Diagnosis present

## 2018-01-26 DIAGNOSIS — F332 Major depressive disorder, recurrent severe without psychotic features: Secondary | ICD-10-CM | POA: Diagnosis not present

## 2018-01-26 DIAGNOSIS — T5492XA Toxic effect of unspecified corrosive substance, intentional self-harm, initial encounter: Secondary | ICD-10-CM | POA: Diagnosis present

## 2018-01-26 DIAGNOSIS — Z5181 Encounter for therapeutic drug level monitoring: Secondary | ICD-10-CM

## 2018-01-26 DIAGNOSIS — Z79899 Other long term (current) drug therapy: Secondary | ICD-10-CM | POA: Diagnosis not present

## 2018-01-26 DIAGNOSIS — K224 Dyskinesia of esophagus: Secondary | ICD-10-CM | POA: Diagnosis present

## 2018-01-26 DIAGNOSIS — F410 Panic disorder [episodic paroxysmal anxiety] without agoraphobia: Secondary | ICD-10-CM | POA: Diagnosis present

## 2018-01-26 DIAGNOSIS — Y903 Blood alcohol level of 60-79 mg/100 ml: Secondary | ICD-10-CM | POA: Diagnosis present

## 2018-01-26 DIAGNOSIS — Z9621 Cochlear implant status: Secondary | ICD-10-CM | POA: Diagnosis present

## 2018-01-26 LAB — TSH: TSH: 1.75 u[IU]/mL (ref 0.350–4.500)

## 2018-01-26 LAB — HEMOGLOBIN A1C
Hgb A1c MFr Bld: 5.2 % (ref 4.8–5.6)
Mean Plasma Glucose: 102.54 mg/dL

## 2018-01-26 LAB — LIPID PANEL
CHOL/HDL RATIO: 6.7 ratio
Cholesterol: 255 mg/dL — ABNORMAL HIGH (ref 0–200)
HDL: 38 mg/dL — ABNORMAL LOW (ref >40)
LDL CALC: UNDETERMINED mg/dL (ref 0–99)
TRIGLYCERIDES: 411 mg/dL — AB (ref <150)
VLDL: UNDETERMINED mg/dL (ref 0–40)

## 2018-01-26 MED ORDER — HYDROXYZINE HCL 50 MG PO TABS
50.0000 mg | ORAL_TABLET | Freq: Three times a day (TID) | ORAL | Status: DC | PRN
  Administered 2018-01-26 – 2018-01-27 (×3): 50 mg via ORAL
  Filled 2018-01-26 (×2): qty 1

## 2018-01-26 MED ORDER — ALUM & MAG HYDROXIDE-SIMETH 200-200-20 MG/5ML PO SUSP: 30.0000 mL | ORAL | Status: DC | PRN

## 2018-01-26 MED ORDER — CHLORDIAZEPOXIDE HCL 25 MG PO CAPS
25.0000 mg | ORAL_CAPSULE | Freq: Four times a day (QID) | ORAL | Status: DC
  Administered 2018-01-26 – 2018-01-28 (×10): 25 mg via ORAL
  Filled 2018-01-26 (×10): qty 1

## 2018-01-26 MED ORDER — PAROXETINE HCL 20 MG PO TABS
20.0000 mg | ORAL_TABLET | Freq: Every day | ORAL | Status: DC
  Administered 2018-01-27 – 2018-01-28 (×2): 20 mg via ORAL
  Filled 2018-01-26 (×2): qty 1

## 2018-01-26 MED ORDER — LITHIUM CARBONATE ER 300 MG PO TBCR
600.0000 mg | EXTENDED_RELEASE_TABLET | Freq: Every day | ORAL | Status: DC
  Administered 2018-01-26: 600 mg via ORAL
  Filled 2018-01-26: qty 2

## 2018-01-26 MED ORDER — PAROXETINE HCL 30 MG PO TABS
60.0000 mg | ORAL_TABLET | Freq: Every day | ORAL | Status: DC
  Administered 2018-01-26: 60 mg via ORAL
  Filled 2018-01-26: qty 2

## 2018-01-26 MED ORDER — TRAZODONE HCL 100 MG PO TABS
100.0000 mg | ORAL_TABLET | Freq: Every day | ORAL | Status: DC
  Administered 2018-01-26: 100 mg via ORAL
  Filled 2018-01-26: qty 1

## 2018-01-26 MED ORDER — MAGNESIUM HYDROXIDE 400 MG/5ML PO SUSP: 30.0000 mL | Freq: Every day | ORAL | Status: DC | PRN

## 2018-01-26 MED ORDER — ROSUVASTATIN CALCIUM 20 MG PO TABS
10.0000 mg | ORAL_TABLET | Freq: Every day | ORAL | Status: DC
  Administered 2018-01-26 – 2018-01-28 (×3): 10 mg via ORAL
  Filled 2018-01-26 (×3): qty 1

## 2018-01-26 MED ORDER — NICOTINE 21 MG/24HR TD PT24
21.0000 mg | MEDICATED_PATCH | Freq: Every day | TRANSDERMAL | Status: DC
  Administered 2018-01-27 – 2018-01-28 (×2): 21 mg via TRANSDERMAL
  Filled 2018-01-26 (×2): qty 1

## 2018-01-26 MED ORDER — ARIPIPRAZOLE 5 MG PO TABS
5.0000 mg | ORAL_TABLET | Freq: Every day | ORAL | Status: DC
  Administered 2018-01-26: 5 mg via ORAL
  Filled 2018-01-26: qty 1

## 2018-01-26 MED ORDER — ACETAMINOPHEN 325 MG PO TABS: 650.0000 mg | ORAL_TABLET | Freq: Four times a day (QID) | ORAL | Status: DC | PRN

## 2018-01-26 MED ORDER — QUETIAPINE FUMARATE 100 MG PO TABS
100.0000 mg | ORAL_TABLET | Freq: Every day | ORAL | Status: DC
  Administered 2018-01-26 – 2018-01-27 (×2): 100 mg via ORAL
  Filled 2018-01-26 (×2): qty 1

## 2018-01-26 NOTE — Progress Notes (Signed)
   01/26/18 1050  Clinical Encounter Type  Visited With Patient;Health care provider  Visit Type Behavioral Health  Referral From Care management  Consult/Referral To Chaplain  Spiritual Encounters  Spiritual Needs Emotional;Other (Comment)   CH attended the PT's treatment team meeting. PT could not hear as he did not have his hearing aids. Interaction was limited due to hearing limits. PT stated that he was a "drunk" and had no other problems. [facility] which this [facility] will do once the PT has received his hearing aids today or tomorrow.

## 2018-01-26 NOTE — Progress Notes (Addendum)
Patient completed EKG. Copy placed in chart. Patient refused lab draw.

## 2018-01-26 NOTE — [facility] Suicide Risk Assessment (Signed)
[facility] Admission Suicide Risk Assessment   Nursing information obtained from:  Patient Demographic factors:  Male, Divorced or widowed, Living alone Current Mental Status:  NA(denies) Loss Factors:  Decrease in vocational status, Loss of significant relationship, Legal issues, Financial problems / change in socioeconomic status Historical Factors:  Impulsivity Risk Reduction Factors:  Responsible for children under 39 years of age, Sense of responsibility to family, Positive social support  Total Time spent with patient: 45 minutes Principal Problem: Major depressive disorder, recurrent severe without psychotic features (HCC) Diagnosis:   Patient Active Problem List   Diagnosis Date Noted  . Major depressive disorder, recurrent severe without psychotic features (HCC) [F33.2] 01/26/2018    Priority: High  . Cannabis use disorder, moderate, dependence (HCC) [F12.20] 01/26/2018  . Tobacco use disorder [F17.200] 01/25/2018  . Alcohol use disorder, severe, dependence (HCC) [F10.20] 02/16/2015  . Abnormal LFTs [R94.5] 02/07/2015  . AA (alcohol abuse) [F10.10] 02/07/2015  . Allergic rhinitis [J30.9] 02/07/2015  . 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 Acid reflux [K21.9] 02/07/2015  . Difficulty hearing [H91.90] 02/07/2015  . HLD (hyperlipidemia) [E78.5] 02/07/2015  . Recurrent major depression-severe (HCC) [F33.2] 02/07/2015  . Alcohol abuse [F10.10] 02/07/2015  . Panic disorder [F41.0] 02/07/2015   Subjective Data: suicidal ideation  Continued Clinical Symptoms:    The "Alcohol Use Disorders Identification Test", Guidelines for Use in Primary Care, Second Edition.  World Science writer [facility]). Score between 0-7:  no or low risk or alcohol related problems. Score between 8-15:  moderate risk of alcohol related problems. Score between 16-19:  high risk of alcohol related problems. Score 20 or above:  warrants further diagnostic evaluation for alcohol dependence and  treatment.   CLINICAL FACTORS:   Depression:   Comorbid alcohol abuse/dependence Impulsivity Alcohol/Substance Abuse/Dependencies   Musculoskeletal: Strength & Muscle Tone: within normal limits Gait & Station: normal Patient leans: N/A  Psychiatric Specialty Exam: Physical Exam  Nursing note and vitals reviewed. Psychiatric: His speech is normal. Thought content normal. His affect is blunt. He is slowed. Cognition and memory are normal. He expresses impulsivity.    Review of Systems  Neurological: Negative.   Psychiatric/Behavioral: Positive for depression and substance abuse.  All other systems reviewed and are negative.   Blood pressure 136/90, pulse 74, temperature 97.6 F (36.4 C), temperature source Oral, resp. rate 18, height 6' (1.829 m), weight 92.1 kg (203 lb), SpO2 100 %.Body mass index is 27.53 kg/m.  General Appearance: Disheveled  Eye Contact:  Good  Speech:  Clear and Coherent  Volume:  Normal  Mood:  Depressed and Dysphoric  Affect:  Tearful  Thought Process:  Goal Directed and Descriptions of Associations: Intact  Orientation:  Full (Time, Place, and Person)  Thought Content:  WDL  Suicidal Thoughts:  Yes.  without intent/plan  Homicidal Thoughts:  No  Memory:  Immediate;   Fair Recent;   Fair Remote;   Fair  Judgement:  Poor  Insight:  Lacking  Psychomotor Activity:  Decreased  Concentration:  Concentration: Fair and Attention Span: Fair  Recall:  [employer] of Knowledge:  Fair  Language:  Fair  Akathisia:  No  Handed:  Right  AIMS (if indicated):     Assets:  Communication Skills Desire for Improvement Housing Physical Health Resilience Social Support  ADL's:  Intact  Cognition:  WNL  Sleep:  Number of Hours: 2.25      COGNITIVE FEATURES THAT CONTRIBUTE TO RISK:  None    SUICIDE RISK:   Moderate:  Frequent suicidal ideation with limited intensity, and duration, some specificity in terms of plans, no associated intent, good  self-control, limited dysphoria/symptomatology, some risk factors present, and identifiable protective factors, including available and accessible social support.  PLAN OF CARE: hospital admission, medication management, substance abuse counseling, discharge planning.  Jonathan [name] is a 37 year old male with a history of depression and alcoholism admitted for suicidal ideation in the context of drinking and severe relationship problems. 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  #Suicidal ideation -patient able to contract for safety in the hospital  #Mood -maintained on Paxil but wants off -lower Paxil to 20 mg daily -start Seroquel 100 mg nightly for mood and to improve sleep  #Insomnia -slept 2 hours only with Trazodone  #Alcohol abuse -Librium taper  #Substance abuse treatment -declines residential treatment -patient interested in Antabuse  #Smoking cessation -nicotine patch is available  #Labs -lipid panel, TSH and A1C  -EKG  #Dyslipidemia -Crestor 10 mg  #Disposition -discharge to home with parents -follow up with his regular psychiatrist  I certify that inpatient services furnished can reasonably be expected to improve the patient's condition.   Kristine LineaJolanta Luanna Weesner, MD 01/26/2018, 11:50 AM

## 2018-01-26 NOTE — H&P (Signed)
Psychiatric Admission Assessment Adult  Patient Identification: Jonathan [name] MRN:  [id number] Date of Evaluation:  01/26/2018 Chief Complaint:  Depression Principal Diagnosis: Major depressive disorder, recurrent severe without psychotic features ([place]) Diagnosis:   Patient Active Problem List   Diagnosis Date Noted  . Major depressive disorder, recurrent severe without psychotic features ([place]) [F33.2] 01/26/2018    Priority: High  . Cannabis use disorder, moderate, dependence ([place]) [F12.20] 01/26/2018  . Tobacco use disorder [F17.200] 01/25/2018  . Alcohol use disorder, severe, dependence ([place]) [F10.20] 02/16/2015  . Abnormal LFTs [R94.5] 02/07/2015  . AA (alcohol abuse) [F10.10] 02/07/2015  . Allergic rhinitis [J30.9] 02/07/2015  . 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 Acid reflux [K21.9] 02/07/2015  . Difficulty hearing [H91.90] 02/07/2015  . HLD (hyperlipidemia) [E78.5] 02/07/2015  . Recurrent major depression-severe ([place]) [F33.2] 02/07/2015  . Alcohol abuse [F10.10] 02/07/2015  . Panic disorder [F41.0] 02/07/2015   History of Present Illness:   Identifying data. [name] is a 37 year old male with alcoholism and mood instability.   Chief complaint. "I am just a drunk."  History of present illness. Information was obtained from the patient and the chart. The patient came to the ER by the police after he texted his ex-wife to inform her that he was drinking bleach to kill himself. He has been under duress after his divorce was finalized after a long battle and he has not been able to see his daughters. In addition, his girlfriend just left him too. His drinking escalated, he stopped taking Paxil for 3 days and became suicidal. Yesterday in the ER he felt that he needs to be treated for bipolar disorder but today he believes that drinking is really the problem. He has been doing fine on Paxil 60 mg and occasional Valium for panic attacks until recently. He has been trying to  curb his drinking and switched from whiskey to beer. He adamantly denies psychotic symptoms or symptoms suggestive of bipolar mania. He has infrequent panic attacks but no other forms of anxiety. He uses cannabis but no other drugs.  Past psychiatric history. He was in rehab once and stayed sober for 3 months. He was at [facility]. He did well on Paxil but feels it stopped working. No suicide attempts. According to his chart, he has been on Lithium, Remeron and Abilify but he denies.   Family psychiatric history. Mother with depression, anxiety, bipolar on Xanax and Valium.  Social history. He is now divorced and no longer in the relationship. Has been staying with his parents more. He has a job that he goes to every day and drinks after work.   Total Time spent with patient: 45 minutes  Is the patient at risk to self? Yes.    Has the patient been a risk to self in the past 6 months? No.  Has the patient been a risk to self within the distant past? No.  Is the patient a risk to others? No.  Has the patient been a risk to others in the past 6 months? No.  Has the patient been a risk to others within the distant past? No.   Prior Inpatient Therapy:   Prior Outpatient Therapy:    Alcohol Screening: Patient refused Alcohol Screening Tool: Yes 2. How many drinks containing alcohol do you have on a typical day when you are drinking?: (beer) Intervention/Follow-up: Patient Refused Substance Abuse History in the last 12 months:  Yes.   Consequences of Substance Abuse: Negative Previous Psychotropic Medications: Yes  Psychological Evaluations: No  Past Medical History:  Past Medical History:  Diagnosis Date  . Anxiety   . Deaf   . Depression   . Gastric reflux   . Liver disease   . Panic disorder     Past Surgical History:  Procedure Laterality Date  . COLON SURGERY    . ear surgey     Family History:  Family History  Problem Relation Age of Onset  . Diabetes Mother   .  Depression Mother   . Anxiety disorder Mother   . COPD Mother   . Heart attack Father   . Diabetes Father     Tobacco Screening: Have you used any form of tobacco in the last 30 days? (Cigarettes, Smokeless Tobacco, Cigars, and/or Pipes): Patient Refused Screening(does not want patch at this time) Social History:  Social History   Substance and Sexual Activity  Alcohol Use Yes  . Alcohol/week: 8.4 oz  . Types: 12 Cans of beer, 2 Shots of liquor per week   Comment: trying to quit last about 2 weeks ago     Social History   Substance and Sexual Activity  Drug Use No    Additional Social History:      Pain Medications: see mar  Prescriptions: see mar Over the Counter: see mar History of alcohol / drug use?: Yes Longest period of sobriety (when/how long): unknown Negative Consequences of Use: Legal, Financial Withdrawal Symptoms: Other (Comment)(denies w/d symptoms from ETOH) Name of Substance 1: alcohol 1 - Age of First Use: 13 1 - Amount (size/oz): varies 1 - Frequency: daily 1 - Duration: varies 1 - Last Use / Amount: yesterday Name of Substance 2: marijuana 2 - Age of First Use: teenager 2 - Amount (size/oz): varies 2 - Frequency: every week 2 - Duration: varies 2 - Last Use / Amount: 3 days ago Name of Substance 3: benzodiazepines 3 - Age of First Use: unknown 3 - Amount (size/oz): varies 3 - Frequency: varies 3 - Duration: varies 3 - Last Use / Amount: recent              Allergies:  No Known Allergies Lab Results:  Results for orders placed or performed during the hospital encounter of 01/24/18 (from the past 48 hour(s))  Comprehensive metabolic panel     Status: Abnormal   Collection Time: 01/24/18 11:46 PM  Result Value Ref Range   Sodium 139 135 - 145 mmol/L   Potassium 4.1 3.5 - 5.1 mmol/L   Chloride 101 101 - 111 mmol/L   CO2 24 22 - 32 mmol/L   Glucose, Bld 92 65 - 99 mg/dL   BUN 10 6 - 20 mg/dL   Creatinine, Ser 0.93 0.61 - 1.24 mg/dL    Calcium 9.8 8.9 - 10.3 mg/dL   Total Protein 8.3 (H) 6.5 - 8.1 g/dL   Albumin 4.9 3.5 - 5.0 g/dL   AST 22 15 - 41 U/L   ALT 25 17 - 63 U/L   Alkaline Phosphatase 100 38 - 126 U/L   Total Bilirubin 1.0 0.3 - 1.2 mg/dL   GFR calc non Af Amer >60 >60 mL/min   GFR calc Af Amer >60 >60 mL/min    Comment: (NOTE) The eGFR has been calculated using the CKD EPI equation. This calculation has not been validated in all clinical situations. eGFR's persistently <60 mL/min signify possible Chronic Kidney Disease.    Anion gap 14 5 - 15    Comment: Performed at [facility], [address] [place], [place] [address]  Ethanol     Status: Abnormal   Collection Time: 01/24/18 11:46 PM  Result Value Ref Range   Alcohol, Ethyl (B) 72 (H) <10 mg/dL    Comment: (NOTE) Lowest detectable limit for serum alcohol is 10 mg/dL. For medical purposes only. Performed at [facility], [address] [place], [place] [address]   Urine Drug Screen, Qualitative     Status: Abnormal   Collection Time: 01/24/18 11:46 PM  Result Value Ref Range   Tricyclic, Ur Screen NONE DETECTED NONE DETECTED   Amphetamines, Ur Screen NONE DETECTED NONE DETECTED   MDMA (Ecstasy)Ur Screen NONE DETECTED NONE DETECTED   Cocaine Metabolite,Ur [place] NONE DETECTED NONE DETECTED   Opiate, Ur Screen NONE DETECTED NONE DETECTED   Phencyclidine (PCP) Ur S NONE DETECTED NONE DETECTED   Cannabinoid 50 Ng, Ur [place] POSITIVE (A) NONE DETECTED   Barbiturates, Ur Screen NONE DETECTED NONE DETECTED   Benzodiazepine, Ur Scrn POSITIVE (A) NONE DETECTED   Methadone Scn, Ur NONE DETECTED NONE DETECTED    Comment: (NOTE) Tricyclics + metabolites, urine    Cutoff 1000 ng/mL Amphetamines + metabolites, urine  Cutoff 1000 ng/mL MDMA (Ecstasy), urine              Cutoff 500 ng/mL Cocaine Metabolite, urine          Cutoff 300 ng/mL Opiate + metabolites, urine        Cutoff 300 ng/mL Phencyclidine (PCP), urine         Cutoff 25  ng/mL Cannabinoid, urine                 Cutoff 50 ng/mL Barbiturates + metabolites, urine  Cutoff 200 ng/mL Benzodiazepine, urine              Cutoff 200 ng/mL Methadone, urine                   Cutoff 300 ng/mL The urine drug screen provides only a preliminary, unconfirmed analytical test result and should not be used for non-medical purposes. Clinical consideration and professional judgment should be applied to any positive drug screen result due to possible interfering substances. A more specific alternate chemical method must be used in order to obtain a confirmed analytical result. Gas chromatography / mass spectrometry (GC/MS) is the preferred confirmat ory method. Performed at [facility], [address] [place], [place] [address]   CBC with Diff     Status: Abnormal   Collection Time: 01/24/18 11:46 PM  Result Value Ref Range   WBC 9.0 3.8 - 10.6 K/uL   RBC 5.86 4.40 - 5.90 MIL/uL   Hemoglobin 18.3 (H) 13.0 - 18.0 g/dL   HCT 52.3 (H) 40.0 - 52.0 %   MCV 89.2 80.0 - 100.0 fL   MCH 31.2 26.0 - 34.0 pg   MCHC 35.0 32.0 - 36.0 g/dL   RDW 12.8 11.5 - 14.5 %   Platelets 349 150 - 440 K/uL   Neutrophils Relative % 72 %   Neutro Abs 6.6 (H) 1.4 - 6.5 K/uL   Lymphocytes Relative 17 %   Lymphs Abs 1.5 1.0 - 3.6 K/uL   Monocytes Relative 8 %   Monocytes Absolute 0.7 0.2 - 1.0 K/uL   Eosinophils Relative 2 %   Eosinophils Absolute 0.2 0 - 0.7 K/uL   Basophils Relative 1 %   Basophils Absolute 0.1 0 - 0.1 K/uL    Comment: Performed  at [facility], [address] [place],  [address]    Blood Alcohol level:  Lab Results  Component Value Date   ETH 72 (H) 01/24/2018   ETH 15 (H) 46/27/0350    Metabolic Disorder Labs:  No results found for: HGBA1C, MPG No results found for: PROLACTIN No results found for: CHOL, TRIG, HDL, CHOLHDL, VLDL, LDLCALC  Current Medications: Current Facility-Administered Medications  Medication Dose Route  Frequency Provider Last Rate Last Dose  . acetaminophen (TYLENOL) tablet 650 mg  650 mg Oral Q6H PRN Samora Jernberg B, MD      . alum & mag hydroxide-simeth (MAALOX/MYLANTA) 200-200-20 MG/5ML suspension 30 mL  30 mL Oral Q4H PRN Dannika Hilgeman B, MD      . ARIPiprazole (ABILIFY) tablet 5 mg  5 mg Oral Daily Daivik Overley B, MD   5 mg at 01/26/18 0915  . chlordiazePOXIDE (LIBRIUM) capsule 25 mg  25 mg Oral QID Malosi Hemstreet B, MD   25 mg at 01/26/18 1151  . hydrOXYzine (ATARAX/VISTARIL) tablet 50 mg  50 mg Oral TID PRN Lorenda Grecco B, MD      . magnesium hydroxide (MILK OF MAGNESIA) suspension 30 mL  30 mL Oral Daily PRN Evelynn Hench B, MD      . nicotine (NICODERM CQ - dosed in mg/24 hours) patch 21 mg  21 mg Transdermal Q0600 Milisa Kimbell B, MD      . Derrill Memo ON 01/27/2018] PARoxetine (PAXIL) tablet 20 mg  20 mg Oral Daily Siaosi Alter B, MD      . rosuvastatin (CRESTOR) tablet 10 mg  10 mg Oral Daily Lavaya Defreitas B, MD   10 mg at 01/26/18 0915  . traZODone (DESYREL) tablet 100 mg  100 mg Oral QHS Marianna Cid B, MD   100 mg at 01/26/18 0207   PTA Medications: Medications Prior to Admission  Medication Sig Dispense Refill Last Dose  . ARIPiprazole (ABILIFY) 5 MG tablet Take 1 tablet (5 mg total) by mouth daily. (Patient not taking: Reported on 01/25/2018) 30 tablet 0 Not Taking at Unknown time  . lithium carbonate (LITHOBID) 300 MG CR tablet Take 2 tablets (600 mg total) by mouth at bedtime. (Patient not taking: Reported on 01/25/2018) 60 tablet 0 Not Taking at Unknown time  . PARoxetine (PAXIL) 30 MG tablet Take 2 tablets (60 mg total) by mouth daily. (Patient not taking: Reported on 01/25/2018) 60 tablet 0 Not Taking at Unknown time  . rosuvastatin (CRESTOR) 10 MG tablet Take 1 tablet (10 mg total) by mouth daily. 30 tablet 0 unknown at unknown    Musculoskeletal: Strength & Muscle Tone: within normal limits Gait & Station:  normal Patient leans: N/A  Psychiatric Specialty Exam: Physical Exam  Nursing note and vitals reviewed. Constitutional: He is oriented to person, place, and time. He appears well-developed and well-nourished.  HENT:  Head: Normocephalic and atraumatic.  Eyes: Pupils are equal, round, and reactive to light. Conjunctivae and EOM are normal.  Neck: Normal range of motion. Neck supple.  Cardiovascular: Normal rate, regular rhythm and normal heart sounds.  Respiratory: Effort normal and breath sounds normal.  GI: Soft. Bowel sounds are normal.  Musculoskeletal: Normal range of motion.  Neurological: He is alert and oriented to person, place, and time.  Skin: Skin is warm and dry.  Psychiatric: His speech is normal. Thought content normal. His affect is blunt. He is slowed and withdrawn. Cognition and memory are normal. He expresses impulsivity. He exhibits a depressed mood.  Review of Systems  Neurological: Negative.   Psychiatric/Behavioral: Positive for depression, substance abuse and suicidal ideas. The patient has insomnia.   All other systems reviewed and are negative.   Blood pressure 136/90, pulse 74, temperature 97.6 F (36.4 C), temperature source Oral, resp. rate 18, height 6' (1.829 m), weight 92.1 kg (203 lb), SpO2 100 %.Body mass index is 27.53 kg/m.  See SRA                                                  Sleep:  Number of Hours: 2.25    Treatment Plan Summary: Daily contact with patient to assess and evaluate symptoms and progress in treatment and Medication management   Mr. Poth is a 37 year old male with a history of depression and alcoholism admitted for suicidal ideation in the context of drinking and severe relationship problems. 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  #Suicidal ideation -patient able to contract for safety in the hospital  #Mood -maintained on Paxil but wants off -lower Paxil to  20 mg daily -start Seroquel 100 mg nightly for mood and to improve sleep  #Insomnia -slept 2 hours only with Trazodone  #Alcohol abuse -Librium taper  #Substance abuse treatment -declines residential treatment -patient interested in Antabuse  #Smoking cessation -nicotine patch is available  #Labs -lipid panel, TSH and A1C  -EKG  #Dyslipidemia -Crestor 10 mg  #Disposition -discharge to home with parents -follow up with his regular psychiatrist   Observation Level/Precautions:  15 minute checks  Laboratory:  CBC Chemistry Profile UDS UA  Psychotherapy:    Medications:    Consultations:    Discharge Concerns:    Estimated LOS:  Other:     Physician Treatment Plan for Primary Diagnosis: Major depressive disorder, recurrent severe without psychotic features ([place]) Long Term Goal(s): Improvement in symptoms so as ready for discharge  Short Term Goals: Ability to identify changes in lifestyle to reduce recurrence of condition will improve, Ability to verbalize feelings will improve, Ability to disclose and discuss suicidal ideas, Ability to demonstrate self-control will improve, Ability to identify and develop effective coping behaviors will improve, Ability to maintain clinical measurements within normal limits will improve and Ability to identify triggers associated with substance abuse/mental health issues will improve  Physician Treatment Plan for Secondary Diagnosis: Principal Problem:   Major depressive disorder, recurrent severe without psychotic features ([place]) Active Problems:   Difficulty hearing   HLD (hyperlipidemia)   Panic disorder   Alcohol use disorder, severe, dependence ([facility])   Tobacco use disorder   Cannabis use disorder, moderate, dependence ([place])  Long Term Goal(s): Improvement in symptoms so as ready for discharge  Short Term Goals: Ability to identify changes in lifestyle to reduce recurrence of condition will improve, Ability to demonstrate  self-control will improve and Ability to identify triggers associated with substance abuse/mental health issues will improve  I certify that inpatient services furnished can reasonably be expected to improve the patient's condition.    Orson Slick, MD 6/12/201912:03 PM

## 2018-01-26 NOTE — ED Notes (Signed)
Hourly rounding reveals patient sleeping in room. No complaints, stable, in no acute distress. Q15 minute rounds and monitoring via Security Cameras to continue. 

## 2018-01-26 NOTE — Plan of Care (Addendum)
Patient found in day room upon my arrival. Patient is visible and social this evening. Patient continues to report depression and anxiety but affect is animated and bright while playing cards with peers. Patient appearance and hygiene is much improved. Patient is cooperative and polite. Denies SI/HI/AVH. Reports eating and voiding adequately. Compliant with HS medications and staff direction. Given Vistaril for anxiety with positive results. Q 15 minute checks maintained. Will continue to monitor throughout the shift.  Patient slept 6.25 hours. No apparent distress. Will endorse care to oncoming shift.   Problem: Pain Managment: Goal: General experience of comfort will improve Outcome: Progressing   Problem: Education: Goal: Knowledge of the prescribed therapeutic regimen will improve Outcome: Progressing   Problem: Activity: Goal: Interest or engagement in leisure activities will improve Outcome: Progressing Goal: Imbalance in normal sleep/wake cycle will improve Outcome: Progressing   Problem: Coping: Goal: Coping ability will improve Outcome: Progressing   Problem: Self-Concept: Goal: Level of anxiety will decrease Outcome: Progressing

## 2018-01-26 NOTE — Plan of Care (Signed)
Patient is aware of [place] information  regarding  programing. Encourage good hygiene  efforts . No respiratory , cardiovascular or elimination   concerns Appetite good  . Working on better Materials engineercoping  Skills.  No safety concerns . Able to verbalize  being an Alcoholic  looking at resources for rehab . Able to verbalize information about medications received  . Attending unit programing . Alble to verbalize positive  things about self.   Problem: Self-Concept: Goal: Ability to disclose and discuss suicidal ideas will improve Outcome: Progressing Goal: Will verbalize positive feelings about self Outcome: Progressing   Problem: Medication: Goal: Compliance with prescribed medication regimen will improve Outcome: Progressing   Problem: Coping: Goal: Coping ability will improve Outcome: Progressing   Problem: Self-Concept: Goal: Ability to identify factors that promote anxiety will improve Outcome: Progressing Goal: Level of anxiety will decrease Outcome: Progressing Goal: Ability to modify response to factors that promote anxiety will improve Outcome: Progressing   Problem: Self-Concept: Goal: Will verbalize positive feelings about self Outcome: Progressing Goal: Level of anxiety will decrease Outcome: Progressing   Problem: Safety: Goal: Ability to disclose and discuss suicidal ideas will improve Outcome: Progressing Goal: Ability to identify and utilize support systems that promote safety will improve Outcome: Progressing   Problem: Health Behavior/Discharge Planning: Goal: Ability to make decisions will improve Outcome: Progressing Goal: Compliance with therapeutic regimen will improve Outcome: Progressing   Problem: Clinical Measurements: Goal: Will remain free from infection Outcome: Not Applicable   Problem: Clinical Measurements: Goal: Respiratory complications will improve Outcome: Not Applicable   Problem: Clinical Measurements: Goal: Cardiovascular  complication will be avoided Outcome: Not Applicable   Problem: Safety: Goal: Ability to remain free from injury will improve Outcome: Not Applicable

## 2018-01-26 NOTE — Tx Team (Addendum)
Interdisciplinary Treatment and Diagnostic Plan Update  01/26/2018 Time of Session: 10:35 AM Jonathan [name] MRN: [id number]  Principal Diagnosis: Major depressive disorder, recurrent severe without psychotic features (HCC)  Secondary Diagnoses: Principal Problem:   Major depressive disorder, recurrent severe without psychotic features (HCC) Active Problems:   Difficulty hearing   HLD (hyperlipidemia)   Panic disorder   Alcohol use disorder, severe, dependence (HCC)   Tobacco use disorder   Cannabis use disorder, moderate, dependence (HCC)   Current Medications:  Current Facility-Administered Medications  Medication Dose Route Frequency Provider Last Rate Last Dose  . acetaminophen (TYLENOL) tablet 650 mg  650 mg Oral Q6H PRN Pucilowska, Jolanta B, MD      . alum & mag hydroxide-simeth (MAALOX/MYLANTA) 200-200-20 MG/5ML suspension 30 mL  30 mL Oral Q4H PRN Pucilowska, Jolanta B, MD      . chlordiazePOXIDE (LIBRIUM) capsule 25 mg  25 mg Oral QID Pucilowska, Jolanta B, MD   25 mg at 01/26/18 1151  . hydrOXYzine (ATARAX/VISTARIL) tablet 50 mg  50 mg Oral TID PRN Pucilowska, Jolanta B, MD      . magnesium hydroxide (MILK OF MAGNESIA) suspension 30 mL  30 mL Oral Daily PRN Pucilowska, Jolanta B, MD      . nicotine (NICODERM CQ - dosed in mg/24 hours) patch 21 mg  21 mg Transdermal Q0600 Pucilowska, Jolanta B, MD      . Melene Muller ON 01/27/2018] PARoxetine (PAXIL) tablet 20 mg  20 mg Oral Daily Pucilowska, Jolanta B, MD      . QUEtiapine (SEROQUEL) tablet 100 mg  100 mg Oral QHS Pucilowska, Jolanta B, MD      . rosuvastatin (CRESTOR) tablet 10 mg  10 mg Oral Daily Pucilowska, Jolanta B, MD   10 mg at 01/26/18 0915   PTA Medications: Medications Prior to Admission  Medication Sig Dispense Refill Last Dose  . ARIPiprazole (ABILIFY) 5 MG tablet Take 1 tablet (5 mg total) by mouth daily. (Patient not taking: Reported on 01/25/2018) 30 tablet 0 Not Taking at Unknown time  . lithium carbonate  (LITHOBID) 300 MG CR tablet Take 2 tablets (600 mg total) by mouth at bedtime. (Patient not taking: Reported on 01/25/2018) 60 tablet 0 Not Taking at Unknown time  . PARoxetine (PAXIL) 30 MG tablet Take 2 tablets (60 mg total) by mouth daily. (Patient not taking: Reported on 01/25/2018) 60 tablet 0 Not Taking at Unknown time  . rosuvastatin (CRESTOR) 10 MG tablet Take 1 tablet (10 mg total) by mouth daily. 30 tablet 0 unknown at unknown    Patient Stressors: Financial difficulties Legal issue Marital or family conflict Occupational concerns Substance abuse  Patient Strengths: Average or above average intelligence Capable of independent living Barrister's clerk for treatment/growth Supportive family/friends  Treatment Modalities: Medication Management, Group therapy, Case management,  1 to 1 session with clinician, Psychoeducation, Recreational therapy.   Physician Treatment Plan for Primary Diagnosis: Major depressive disorder, recurrent severe without psychotic features (HCC) Long Term Goal(s): Improvement in symptoms so as ready for discharge Improvement in symptoms so as ready for discharge   Short Term Goals: Ability to identify changes in lifestyle to reduce recurrence of condition will improve Ability to verbalize feelings will improve Ability to disclose and discuss suicidal ideas Ability to demonstrate self-control will improve Ability to identify and develop effective coping behaviors will improve Ability to maintain clinical measurements within normal limits will improve Ability to identify triggers associated with substance abuse/mental health issues will improve Ability to identify  changes in lifestyle to reduce recurrence of condition will improve Ability to demonstrate self-control will improve Ability to identify triggers associated with substance abuse/mental health issues will improve  Medication Management: Evaluate patient's response, side effects,  and tolerance of medication regimen.  Therapeutic Interventions: 1 to 1 sessions, Unit Group sessions and Medication administration.  Evaluation of Outcomes: Progressing  Physician Treatment Plan for Secondary Diagnosis: Principal Problem:   Major depressive disorder, recurrent severe without psychotic features (HCC) Active Problems:   Difficulty hearing   HLD (hyperlipidemia)   Panic disorder   Alcohol use disorder, severe, dependence (HCC)   Tobacco use disorder   Cannabis use disorder, moderate, dependence (HCC)  Long Term Goal(s): Improvement in symptoms so as ready for discharge Improvement in symptoms so as ready for discharge   Short Term Goals: Ability to identify changes in lifestyle to reduce recurrence of condition will improve Ability to verbalize feelings will improve Ability to disclose and discuss suicidal ideas Ability to demonstrate self-control will improve Ability to identify and develop effective coping behaviors will improve Ability to maintain clinical measurements within normal limits will improve Ability to identify triggers associated with substance abuse/mental health issues will improve Ability to identify changes in lifestyle to reduce recurrence of condition will improve Ability to demonstrate self-control will improve Ability to identify triggers associated with substance abuse/mental health issues will improve     Medication Management: Evaluate patient's response, side effects, and tolerance of medication regimen.  Therapeutic Interventions: 1 to 1 sessions, Unit Group sessions and Medication administration.  Evaluation of Outcomes: Progressing   RN Treatment Plan for Primary Diagnosis: Major depressive disorder, recurrent severe without psychotic features (HCC) Long Term Goal(s): Knowledge of disease and therapeutic regimen to maintain health will improve  Short Term Goals: Ability to disclose and discuss suicidal ideas, Ability to identify and  develop effective coping behaviors will improve and Compliance with prescribed medications will improve  Medication Management: RN will administer medications as ordered by provider, will assess and evaluate patient's response and provide education to patient for prescribed medication. RN will report any adverse and/or side effects to prescribing provider.  Therapeutic Interventions: 1 on 1 counseling sessions, Psychoeducation, Medication administration, Evaluate responses to treatment, Monitor vital signs and CBGs as ordered, Perform/monitor CIWA, COWS, AIMS and Fall Risk screenings as ordered, Perform wound care treatments as ordered.  Evaluation of Outcomes: Progressing   LCSW Treatment Plan for Primary Diagnosis: Major depressive disorder, recurrent severe without psychotic features (HCC) Long Term Goal(s): Safe transition to appropriate next level of care at discharge, Engage patient in therapeutic group addressing interpersonal concerns.  Short Term Goals: Engage patient in aftercare planning with referrals and resources and Increase skills for wellness and recovery  Therapeutic Interventions: Assess for all discharge needs, 1 to 1 time with Social worker, Explore available resources and support systems, Assess for adequacy in community support network, Educate family and significant other(s) on suicide prevention, Complete Psychosocial Assessment, Interpersonal group therapy.  Evaluation of Outcomes: Progressing   Progress in Treatment: Attending groups: Yes. Participating in groups: Yes. Taking medication as prescribed: Yes. Toleration medication: Yes. Family/Significant other contact made: No, will contact:  CSW given consent to contact pt's mother, Larey BrickDeborah Hsiao. Patient understands diagnosis: Yes. Discussing patient identified problems/goals with staff: Yes. Medical problems stabilized or resolved: Yes. Denies suicidal/homicidal ideation: Yes. Issues/concerns per patient  self-inventory: No. Other: n/a  New problem(s) identified: Yes, Describe:  Pt has cochlear implants and did not have the implant battery so he could  not communicate well during tx team meeting.  New Short Term/Long Term Goal(s):  Patient Goals:  "Get my feet back on track.  I need to be on meds that is going to help decrease my urge to drink"  Discharge Plan or Barriers: Tentative plan for pt to return to his home in [place], [place] with follow-up services at [facility]  Reason for Continuation of Hospitalization: Medication stabilization Withdrawal symptoms  Estimated Length of Stay: 3-5 days  Recreational Therapy: Patient Stressors: Relationship Patient Goal: Patient will successfully identify 2 ways of making healthy decisions post d/c within 5 recreation therapy group sessions  Attendees: Patient: [place] Festa 01/26/2018 3:53 PM  Physician: Kristine Linea, MD 01/26/2018 3:53 PM  Nursing: Hulan Amato, RN 01/26/2018 3:53 PM  RN Care Manager: 01/26/2018 3:53 PM  Social Worker: Huey Romans, LCSW 01/26/2018 3:53 PM  Recreational Therapist: Garret Reddish, LRT 01/26/2018 3:53 PM  Other: Heidi Dach, LCSW 01/26/2018 3:53 PM  Other: Johny Shears, LCSWA 01/26/2018 3:53 PM  Other: Damian Leavell, Chaplain 01/26/2018 3:53 PM    Scribe for Treatment Team: Alease Frame, LCSW 01/26/2018 3:53 PM

## 2018-01-26 NOTE — Progress Notes (Signed)
D: Patient stated slept good last night .Stated appetite fair and energy level  lowl. Stated concentration poor. Stated on Depression scale 9 , hopeless 8 and anxiety 10 .( low 0-10 high) Denies suicidal  homicidal ideations  .  No auditory hallucinations  No pain concerns . Appropriate ADL'S. Interacting with peers and staff.  Patient is aware of [place] information  regarding  programing. Encourage good hygiene  efforts . No respiratory , cardiovascular or elimination   concerns Appetite good  . Working on better Materials engineercoping  Skills.  No safety concerns . Able to verbalize  being an Alcoholic  looking at resources for rehab . Able to verbalize information about medications received  . Attending unit programing . Alble to verbalize positive  things about self.   A: Encourage patient participation with unit programming . Instruction  Given on  Medication , verbalize understanding.  R: Voice no other concerns. Staff continue to monitor
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
## 2018-01-26 NOTE — Progress Notes (Signed)
Recreation Therapy Notes  Date: 01/26/2018  Time: 9:30 am   Location: Craft Room   Behavioral response: N/A   Intervention Topic:  Communication  Discussion/Intervention: Patient did not attend group.   Clinical Observations/Feedback:  Patient did not attend group.   Brendt Dible LRT/CTRS        Jonathan [name] 01/26/2018 11:23 AM 

## 2018-01-26 NOTE — Tx Team (Signed)
Initial Treatment Plan 01/26/2018 1:54 AM [name] ZOX:[id number]    PATIENT STRESSORS: Financial difficulties Legal issue Marital or family conflict Occupational concerns Substance abuse   PATIENT STRENGTHS: Average or above average intelligence Capable of independent living Communication skills Motivation for treatment/growth Supportive family/friends   PATIENT IDENTIFIED PROBLEMS: Substance "change my meds, they are not working"  Depression "I worry about my kids"                   DISCHARGE CRITERIA:  Ability to meet basic life and health needs Adequate post-discharge living arrangements Improved stabilization in mood, thinking, and/or behavior Medical problems require only outpatient monitoring Motivation to continue treatment in a less acute level of care Need for constant or close observation no longer present Reduction of life-threatening or endangering symptoms to within safe limits Safe-care adequate arrangements made Verbal commitment to aftercare and medication compliance  PRELIMINARY DISCHARGE PLAN: Attend 12-step recovery group Outpatient therapy Participate in family therapy Return to previous living arrangement Return to previous work or school arrangements  PATIENT/FAMILY INVOLVEMENT: This treatment plan has been presented to and reviewed with the patient, [name].  The patient has been given the opportunity to ask questions and make suggestions.  Sylvan CheeseSteven M Nioma Mccubbins, RN 01/26/2018, 1:54 AM
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
## 2018-01-26 NOTE — BHH Group Notes (Signed)
B[facility] Group Notes:  (Nursing/MHT/Case Management/Adjunct)  Date:  01/26/2018  Time:  9:32 PM  Type of Therapy:  Group Therapy  Participation Level:  Active  Participation Quality:  Appropriate  Affect:  Appropriate  Cognitive:  Appropriate  Insight:  Appropriate  Engagement in Group:  Engaged  Modes of Intervention:  Discussion  Summary of Progress/Problems:  Burt EkJanice Marie Aunesti Pellegrino 01/26/2018, 9:32 PM

## 2018-01-26 NOTE — [facility] Group Notes (Signed)
LCSW Group Therapy Note  01/26/2018 1:00 pm  Type of Therapy/Topic:  Group Therapy:  Emotion Regulation  Participation Level:  Active   Description of Group:    The purpose of this group is to assist patients in learning to regulate negative emotions and experience positive emotions. Patients will be guided to discuss ways in which they have been vulnerable to their negative emotions. These vulnerabilities will be juxtaposed with experiences of positive emotions or situations, and patients will be challenged to use positive emotions to combat negative ones. Special emphasis will be placed on coping with negative emotions in conflict situations, and patients will process healthy conflict resolution skills.  Therapeutic Goals: 1. Patient will identify two positive emotions or experiences to reflect on in order to balance out negative emotions 2. Patient will label two or more emotions that they find the most difficult to experience 3. Patient will demonstrate positive conflict resolution skills through discussion and/or role plays  Summary of Patient Progress:  Jonathan [name] actively participated in today's group discussion on emotion regulation.  Jonathan [name] was able to identify two positive emotions to include feelings of faithfulness (belief in a higher power) and joy.  He also shared that he experiences negative feelings of stress/anxiety and hopelessness with anxiety being the most difficult emotion for him.  Jonathan [name] shared that he often uses marijuana and recently alcohol to help with his anxiety and knows that he may not be the best coping strategy for him to use.  He agreed that he needs to learn more positive, non-drug coping strategies to help with his emotion regulation.     Therapeutic Modalities:   Cognitive Behavioral Therapy Feelings Identification Dialectical Behavioral Therapy

## 2018-01-27 MED ORDER — DISULFIRAM 250 MG PO TABS
250.0000 mg | ORAL_TABLET | Freq: Every day | ORAL | Status: DC
  Administered 2018-01-28: 250 mg via ORAL
  Filled 2018-01-27 (×4): qty 1

## 2018-01-27 MED ORDER — ROSUVASTATIN CALCIUM 10 MG PO TABS: 10.0000 mg | ORAL_TABLET | Freq: Every day | ORAL | 1 refills | Status: AC

## 2018-01-27 MED ORDER — PAROXETINE HCL 20 MG PO TABS: 20.0000 mg | ORAL_TABLET | Freq: Every day | ORAL | 1 refills | Status: DC

## 2018-01-27 MED ORDER — HYDROXYZINE HCL 50 MG PO TABS
50.0000 mg | ORAL_TABLET | Freq: Three times a day (TID) | ORAL | 1 refills | Status: DC | PRN
Start: 2018-01-27 — End: 2021-03-18

## 2018-01-27 MED ORDER — QUETIAPINE FUMARATE 100 MG PO TABS: 100.0000 mg | ORAL_TABLET | Freq: Every day | ORAL | 1 refills | Status: DC

## 2018-01-27 MED ORDER — DISULFIRAM 250 MG PO TABS: 250.0000 mg | ORAL_TABLET | Freq: Every day | ORAL | 1 refills | Status: DC

## 2018-01-27 NOTE — Progress Notes (Signed)
Recreation Therapy Notes  Date: 01/27/2018  Time: 9:30 am  Location: Room 21  Behavioral response: Appropriate  Intervention Topic: Animal Assisted Therapy  Discussion/Intervention:  Patient participated in Animal Assisted Therapy during group today. Group facilitator defined Animal Assisted Therapy as the use of animals as a therapeutic tool to assist a person in restoring balance to their life.  The group facilitator also described the benefits of Animal Assisted Therapy as improving patients' mental, physical, social and emotional functioning with the aid of animals; depending on the needs of the patient. Individuals in the group were able to pet the dogs as well as ask questions.  Clinical Observations/Feedback:  Patient came to group and was focused on what peers and staff had to say about the topic at hand. Individual was social with peers and staff while stay on topic during group. Participant was engaged with the dogs during group. Patient came to group late due to unknown reasons. Jonathan [name] LRT/CTRS         Jonathan [name] 01/27/2018 11:02 AM

## 2018-01-27 NOTE — Progress Notes (Signed)
Recreation Therapy Notes  INPATIENT RECREATION THERAPY ASSESSMENT  Patient Details Name: Jonathan [name] MRN: [id number] DOB: 1980/09/11 Today's Date: 01/27/2018       Information Obtained From: Patient  Able to Participate in Assessment/Interview: Yes  Patient Presentation: Responsive  Reason for Admission (Per Patient): Med Non-Compliance, Suicidal Ideation  Patient Stressors: Relationship  Coping Skills:   Substance Abuse, Other (Comment), Exercise(Family time)  Leisure Interests (2+):  Social - Family  Frequency of Recreation/Participation: Weekly  Awareness of Community Resources:  Yes  Community Resources:  Library  Current Use: Yes  If no, Barriers?:    Expressed Interest in [employer] CorporationCommunity Resource Information:    [place]  Patient Main Form of Transportation: Set designerCar  Patient Strengths:  Communication, Leadership  Patient Identified Areas of Improvement:  Spending more time with family  Patient Goal for Hospitalization:  To stop drinking  Current SI (including self-harm):  No  Current HI:  No  Current AVH: No  Staff Intervention Plan: Collaborate with Interdisciplinary Treatment Team, Group Attendance  Consent to Intern Participation: N/A  Jonathan [name] 01/27/2018, 4:11 PM

## 2018-01-27 NOTE — [facility] Group Notes (Signed)
LCSW Group Therapy Note 01/27/2018 9:00AM  Type of Therapy and Topic:  Group Therapy:  Setting Goals  Participation Level:  Active  Description of Group: In this process group, patients discussed using strengths to work toward goals and address challenges.  Patients identified two positive things about themselves and one goal they were working on.  Patients were given the opportunity to share openly and support each other's plan for self-empowerment.  The group discussed the value of gratitude and were encouraged to have a daily reflection of positive characteristics or circumstances.  Patients were encouraged to identify a plan to utilize their strengths to work on current challenges and goals.  Therapeutic Goals 1. Patient will verbalize personal strengths/positive qualities and relate how these can assist with achieving desired personal goals 2. Patients will verbalize affirmation of peers plans for personal change and goal setting 3. Patients will explore the value of gratitude and positive focus as related to successful achievement of goals 4. Patients will verbalize a plan for regular reinforcement of personal positive qualities and circumstances.  Summary of Patient Progress: Jonathan [name] actively participated in today's group on setting goals using the SMART Model.  Jonathan [name] appeared to have a good understanding of how he can apply the SMART Model to establishing his own life goals.  Jonathan [name] shared that one goal that he would like to start working on while in the hospital is to "stay positive".  Jonathan [name] shared that he can do this by having positive thoughts about himself and his life as he feels that this can help him to make more positive choices in his life after he is discharged from the hospital.      Therapeutic Modalities Cognitive Behavioral Therapy Motivational Interviewing    Jonathan FrameSonya S Zaira Iacovelli, LCSW 01/27/2018 11:14 AM

## 2018-01-27 NOTE — Progress Notes (Signed)
   01/27/18 1505  Clinical Encounter Type  Visited With Patient  Visit Type Initial;Psychological support;Behavioral Health  Referral From Care management  Consult/Referral To Chaplain  Spiritual Encounters  Spiritual Needs Prayer;Emotional;Grief support   CH followed up with PT from encounter 01/26/18. PT received his hearing aid today and was able to [facility] and [facility] to [facility]. [facility] as it was the rec time for the PT's in the unit. PT was shotoing basketball with 3 other PT's. The interaction between the PT and the other basketball players(who were also PT's) was very appropriate. PT asked CH if he could continue to play basketball and talk with the [facility] later. CH informed PT that he would just wait for the PT to finish PT's rec time. When PT was finished with rec time [facility] and PT talked in one of the [facility] consult [facility]. PT was emotional about his current status in life. PT stated that he was a alcoholic. PT recently (2 years ago) signed away parental rights to his son. This incident is (what the PT believes) triggered his drinking. PT is concerned that he will go to [place] for signing away his Parental rights. PT appears to be dealing with a lot of guilt and regret over past decisions. CH encouraged PT and offered that PT may want to find a counselor to talk with once he is released. PT wanted a "pastor type" of counselor. CH shared that pastoral counseling could be what he is looking for in a counselor. CH will attempt to facilitate the location of a counselor for when PT is released. CH informed PT that he would return tomorrow to talk some more.

## 2018-01-27 NOTE — Plan of Care (Signed)
Patient is alert and oriented X 3. Patient denies SI, HI and AVH. Patient complains of anxiety and depression but affect is very animated and patient is seen socializing and smiling with peers in the milieu. Patient very pleasant and compliant with medications.Nurse will continue to monitor. Problem: Pain Managment: Goal: General experience of comfort will improve Outcome: Progressing   Problem: Education: Goal: Knowledge of disease or condition will improve Outcome: Progressing Goal: Understanding of discharge needs will improve Outcome: Progressing   Problem: Physical Regulation: Goal: Complications related to the disease process, condition or treatment will be avoided or minimized Outcome: Progressing

## 2018-01-27 NOTE — Progress Notes (Signed)
[facility]  01/27/2018 3:00 PM [name]  MRN:  [id number]  Subjective:    [name] has been very anxious today. He would like to be home for father's day but worries that it may not be possible. He feels much better now while on Librium and seems not to understand that this is for detox only. Spend time with Jonathan [name] this morning to discuss a plan for substance abuse treatment. He insists on heaving Antabuse but his outpatient psychiatrist would not prescribe. He now adamantly denies that he tried to kill himself and denies ever drinking chlorax.   Spoke with Jonathan [name]. The patient declines rehab but is open to SA IOP at [facility] where they have meetings at night.  Principal Problem: Major depressive disorder, recurrent severe without psychotic features (HCC) Diagnosis:   Patient Active Problem List   Diagnosis Date Noted  . Major depressive disorder, recurrent severe without psychotic features (HCC) [F33.2] 01/26/2018    Priority: High  . Cannabis use disorder, moderate, dependence (HCC) [F12.20] 01/26/2018  . Tobacco use disorder [F17.200] 01/25/2018  . Alcohol use disorder, severe, dependence (HCC) [F10.20] 02/16/2015  . Abnormal LFTs [R94.5] 02/07/2015  . AA (alcohol abuse) [F10.10] 02/07/2015  . Allergic rhinitis [J30.9] 02/07/2015  . 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 Acid reflux [K21.9] 02/07/2015  . Difficulty hearing [H91.90] 02/07/2015  . HLD (hyperlipidemia) [E78.5] 02/07/2015  . Recurrent major depression-severe (HCC) [F33.2] 02/07/2015  . Alcohol abuse [F10.10] 02/07/2015  . Panic disorder [F41.0] 02/07/2015   Total Time spent with patient: 45 minutes  Past Psychiatric History: alcoholism  Past Medical History:  Past Medical History:  Diagnosis Date  . Anxiety   . Deaf   . Depression   . Gastric reflux   . Liver disease   . Panic disorder     Past Surgical History:  Procedure Laterality Date  . COLON SURGERY    . ear surgey      Family History:  Family History  Problem Relation Age of Onset  . Diabetes Mother   . Depression Mother   . Anxiety disorder Mother   . COPD Mother   . Heart attack Father   . Diabetes Father    Family Psychiatric  History: depression Social History:  Social History   Substance and Sexual Activity  Alcohol Use Yes  . Alcohol/week: 8.4 oz  . Types: 12 Cans of beer, 2 Shots of liquor per week   Comment: trying to quit last about 2 weeks ago     Social History   Substance and Sexual Activity  Drug Use No    Social History   Socioeconomic History  . Marital status: Married    Spouse name: Not on file  . Number of children: Not on file  . Years of education: Not on file  . Highest education level: Not on file  Occupational History  . Not on file  Social Needs  . Financial resource strain: Not on file  . Food insecurity:    Worry: Not on file    Inability: Not on file  . Transportation needs:    Medical: Not on file    Non-medical: Not on file  Tobacco Use  . Smoking status: Current Every Day Smoker    Packs/day: 1.00    Types: Cigarettes    Start date: 02/07/2000  . Smokeless tobacco: Current User    Types: Snuff, Chew  Substance and Sexual Activity  . Alcohol use: Yes  Alcohol/week: 8.4 oz    Types: 12 Cans of beer, 2 Shots of liquor per week    Comment: trying to quit last about 2 weeks ago  . Drug use: No  . Sexual activity: Yes    Birth control/protection: None  Lifestyle  . Physical activity:    Days per week: Not on file    Minutes per session: Not on file  . Stress: Not on file  Relationships  . Social connections:    Talks on phone: Not on file    Gets together: Not on file    Attends religious service: Not on file    Active member of club or organization: Not on file    Attends meetings of clubs or organizations: Not on file    Relationship status: Not on file  Other Topics Concern  . Not on file  Social History Narrative  . Not on  file   Additional Social History:    Pain Medications: see mar  Prescriptions: see mar Over the Counter: see mar History of alcohol / drug use?: Yes Longest period of sobriety (when/how long): unknown Negative Consequences of Use: Legal, Financial Withdrawal Symptoms: Other (Comment)(denies w/d symptoms from ETOH) Name of Substance 1: alcohol 1 - Age of First Use: 13 1 - Amount (size/oz): varies 1 - Frequency: daily 1 - Duration: varies 1 - Last Use / Amount: yesterday Name of Substance 2: marijuana 2 - Age of First Use: teenager 2 - Amount (size/oz): varies 2 - Frequency: every week 2 - Duration: varies 2 - Last Use / Amount: 3 days ago Name of Substance 3: benzodiazepines 3 - Age of First Use: unknown 3 - Amount (size/oz): varies 3 - Frequency: varies 3 - Duration: varies 3 - Last Use / Amount: recent              Sleep: Fair  Appetite:  Fair  Current Medications: Current Facility-Administered Medications  Medication Dose Route Frequency Provider Last Rate Last Dose  . acetaminophen (TYLENOL) tablet 650 mg  650 mg Oral Q6H PRN Larisha Vencill B, MD      . alum & mag hydroxide-simeth (MAALOX/MYLANTA) 200-200-20 MG/5ML suspension 30 mL  30 mL Oral Q4H PRN Sagal Gayton B, MD      . chlordiazePOXIDE (LIBRIUM) capsule 25 mg  25 mg Oral QID Orlondo Holycross B, MD   25 mg at 01/27/18 1158  . hydrOXYzine (ATARAX/VISTARIL) tablet 50 mg  50 mg Oral TID PRN Jamaury Gumz B, MD   50 mg at 01/27/18 0824  . magnesium hydroxide (MILK OF MAGNESIA) suspension 30 mL  30 mL Oral Daily PRN Dimitriy Carreras B, MD      . nicotine (NICODERM CQ - dosed in mg/24 hours) patch 21 mg  21 mg Transdermal Q0600 Samariyah Cowles B, MD   21 mg at 01/27/18 0606  . PARoxetine (PAXIL) tablet 20 mg  20 mg Oral Daily Kaison Mcparland B, MD   20 mg at 01/27/18 0824  . QUEtiapine (SEROQUEL) tablet 100 mg  100 mg Oral QHS Malakai Schoenherr B, MD   100 mg at 01/26/18 2110  .  rosuvastatin (CRESTOR) tablet 10 mg  10 mg Oral Daily Minette Manders B, MD   10 mg at 01/27/18 4098    Lab Results:  Results for orders placed or performed during the hospital encounter of 01/26/18 (from the past 48 hour(s))  Hemoglobin A1c     Status: None   Collection Time: 01/26/18 12:33 PM  Result Value  Ref Range   Hgb A1c MFr Bld 5.2 4.8 - 5.6 %    Comment: (NOTE) Pre diabetes:          5.7%-6.4% Diabetes:              >6.4% Glycemic control for   <7.0% adults with diabetes    Mean Plasma Glucose 102.54 mg/dL    Comment: Performed at [facility], [address], [place], [place] [address]  Lipid panel     Status: Abnormal   Collection Time: 01/26/18 12:33 PM  Result Value Ref Range   Cholesterol 255 (H) 0 - 200 mg/dL   Triglycerides 604 (H) <150 mg/dL   HDL 38 (L) >54 mg/dL   Total CHOL/HDL Ratio 6.7 RATIO   VLDL UNABLE TO CALCULATE IF TRIGLYCERIDE OVER 400 mg/dL 0 - 40 mg/dL   LDL Cholesterol UNABLE TO CALCULATE IF TRIGLYCERIDE OVER 400 mg/dL 0 - 99 mg/dL    Comment:        Total Cholesterol/HDL:CHD Risk Coronary Heart Disease Risk Table                     Men   Women  1/2 Average Risk   3.4   3.3  Average Risk       5.0   4.4  2 X Average Risk   9.6   7.1  3 X Average Risk  23.4   11.0        Use the calculated Patient Ratio above and the CHD Risk Table to determine the patient's CHD Risk.        ATP III CLASSIFICATION (LDL):  <100     mg/dL   Optimal  098-119  mg/dL   Near or Above                    Optimal  130-159  mg/dL   Borderline  147-829  mg/dL   High  >562     mg/dL   Very High Performed at [facility], [address] [place], [place] [address]   TSH     Status: None   Collection Time: 01/26/18 12:33 PM  Result Value Ref Range   TSH 1.750 0.350 - 4.500 uIU/mL    Comment: Performed by a 3rd Generation assay with a functional sensitivity of <=0.01 uIU/mL. Performed at [facility], [address] [place],  [place] [address]     Blood Alcohol level:  Lab Results  Component Value Date   ETH 72 (H) 01/24/2018   ETH 15 (H) 10/02/2015    Metabolic Disorder Labs: Lab Results  Component Value Date   HGBA1C 5.2 01/26/2018   MPG 102.54 01/26/2018   No results found for: PROLACTIN Lab Results  Component Value Date   CHOL 255 (H) 01/26/2018   TRIG 411 (H) 01/26/2018   HDL 38 (L) 01/26/2018   CHOLHDL 6.7 01/26/2018   VLDL UNABLE TO CALCULATE IF TRIGLYCERIDE OVER 400 mg/dL 96/29/5284   LDLCALC UNABLE TO CALCULATE IF TRIGLYCERIDE OVER 400 mg/dL 13/24/4010    Physical Findings: AIMS: Facial and Oral Movements Muscles of Facial Expression: None, normal Lips and Perioral Area: None, normal Jaw: None, normal Tongue: None, normal,Extremity Movements Upper (arms, wrists, hands, fingers): None, normal Lower (legs, knees, ankles, toes): None, normal, Trunk Movements Neck, shoulders, hips: None, normal, Overall Severity Severity of abnormal movements (highest score from questions above): None, normal Incapacitation due to abnormal movements: None, normal Patient's awareness of abnormal  movements (rate only patient's report): No Awareness, Dental Status Current problems with teeth and/or dentures?: No Does patient usually wear dentures?: No  CIWA:  CIWA-Ar Total: 0 COWS:     Musculoskeletal: Strength & Muscle Tone: within normal limits Gait & Station: normal Patient leans: N/A  Psychiatric Specialty Exam: Physical Exam  Nursing note and vitals reviewed. Psychiatric: His speech is normal and behavior is normal. Thought content normal. His mood appears anxious. Cognition and memory are normal. He expresses impulsivity.    Review of Systems  Neurological: Negative.   Psychiatric/Behavioral: Positive for depression and substance abuse. The patient has insomnia.   All other systems reviewed and are negative.   Blood pressure 127/81, pulse 72, temperature 97.8 F (36.6 C), temperature source  Oral, resp. rate 18, height 6' (1.829 m), weight 92.1 kg (203 lb), SpO2 96 %.Body mass index is 27.53 kg/m.  General Appearance: Casual  Eye Contact:  Good  Speech:  Clear and Coherent  Volume:  Normal  Mood:  Anxious  Affect:  Appropriate  Thought Process:  Goal Directed and Descriptions of Associations: Intact  Orientation:  Full (Time, Place, and Person)  Thought Content:  WDL  Suicidal Thoughts:  No  Homicidal Thoughts:  No  Memory:  Immediate;   Fair Recent;   Fair Remote;   Fair  Judgement:  Poor  Insight:  Shallow  Psychomotor Activity:  Normal  Concentration:  Concentration: Fair and Attention Span: Fair  Recall:  [employer] of Knowledge:  Fair  Language:  Fair  Akathisia:  No  Handed:  Right  AIMS (if indicated):     Assets:  Communication Skills Desire for Improvement Housing Physical Health Resilience Social Support Vocational/Educational  ADL's:  Intact  Cognition:  WNL  Sleep:  Number of Hours: 6.25     Treatment Plan Summary: Daily contact with patient to assess and evaluate symptoms and progress in treatment and Medication management   Mr. Gum is a 37 year old male with a history of depression and alcoholism admitted for suicidal ideation in the context of drinking and severe relationship problems. The patient is deaf but has his batteries now able to participate in treatment.   #Suicidal ideation, resolved -patient able to contract for safety in the hospital  #Mood, improving -maintained on Paxil but wants off -lower Paxil to 20 mg daily -start Seroquel 100 mg nightly for mood and to improve sleep  #Insomnia, resolved -slept 6 hours with seroquel  #Alcohol abuse -Librium taper -start Antabuse   #Substance abuse treatment -declines residential treatment -interested in IOP  #Smoking cessation -nicotine patch is available  #Labs -lipid panel shows elevated Chol and TG, TSH and A1C unremarkable -EKG reviewed, NSR with QTc  440  #Dyslipidemia -Crestor 10 mg  #Disposition -discharge to home with parents -follow up with his regular psychiatrist     Kristine Linea, MD 01/27/2018, 3:00 PM

## 2018-01-27 NOTE — [facility] Suicide Risk Assessment (Deleted)
[facility] INPATIENT:  Family/Significant Other Suicide Prevention Education  Suicide Prevention Education:  Family/Significant Other Refusal to Support Patient after Discharge:  Suicide Prevention Education Not Provided:  Patient has identified the following family member as the person who will providing support to him after discharge.  With written consent of the patient, an attempt were made to provide Suicide Prevention Education to Foot LockerDeborah Wiltsey at [phone].  Ms. Adriana SimasCook shared that she will not be providing any support or responsibility to pt when he is discharged from the hospital.  Alease FrameSonya S Anie Juniel, LCSW 01/27/2018,3:23 PM

## 2018-01-27 NOTE — [facility] Group Notes (Signed)
  01/27/2018  Time: 1PM  Type of Therapy/Topic:  Group Therapy:  Balance in Life  Participation Level:  Did Not Attend  Description of Group:   This group will address the concept of balance and how it feels and looks when one is unbalanced. Patients will be encouraged to process areas in their lives that are out of balance and identify reasons for remaining unbalanced. Facilitators will guide patients in utilizing problem-solving interventions to address and correct the stressor making their life unbalanced. Understanding and applying boundaries will be explored and addressed for obtaining and maintaining a balanced life. Patients will be encouraged to explore ways to assertively make their unbalanced needs known to significant others in their lives, using other group members and facilitator for support and feedback.  Therapeutic Goals: 1. Patient will identify two or more emotions or situations they have that consume much of in their lives. 2. Patient will identify signs/triggers that life has become out of balance:  3. Patient will identify two ways to set boundaries in order to achieve balance in their lives:  4. Patient will demonstrate ability to communicate their needs through discussion and/or role plays  Summary of Patient Progress: Pt was invited to attend group but chose not to attend. CSW will continue to encourage pt to attend group throughout their admission.    Therapeutic Modalities:   Cognitive Behavioral Therapy Solution-Focused Therapy Assertiveness Training  Heidi DachKelsey Rishikesh Khachatryan, MSW, LCSW Clinical Social Worker 01/27/2018 1:57 PM

## 2018-01-27 NOTE — [facility] Suicide Risk Assessment (Signed)
[facility] INPATIENT:  Family/Significant Other Suicide Prevention Education  Suicide Prevention Education:  Education Completed; Jonathan [name] (mother) at [phone]  has been identified by the patient as the family member who will aid the patient in the event of a mental health crisis (suicidal ideations/suicide attempt).  With written consent from the patient, the family member has been provided the following suicide prevention education, prior to the and/or following the discharge of the patient.  The suicide prevention education provided includes the following:  Suicide risk factors  Suicide prevention and interventions  National Suicide Hotline telephone number  [facility] assessment telephone number  Emergency Assistance 911  [facility] and/or Residential Mobile Crisis Unit telephone number  Request made of family/significant other to:  Remove weapons (e.g., guns, rifles, knives), all items previously/currently identified as safety concern.    Remove drugs/medications (over-the-counter, prescriptions, illicit drugs), all items previously/currently identified as a safety concern.  The family member verbalizes understanding of the suicide prevention education information provided.  The family member agrees to remove the items of safety concern listed above from pt's home if found.  Jonathan FrameSonya S Sagan Maselli, LCSW 01/27/2018, 4:11 PM

## 2018-01-28 DIAGNOSIS — F332 Major depressive disorder, recurrent severe without psychotic features: Principal | ICD-10-CM

## 2018-01-28 NOTE — Progress Notes (Signed)
Patient ID: Nadara ModeJames W Smigel, male   DOB: [birth year], 37 y.o.   MRN: [id number] CSW assisted pt with completing the application for the [facility].  CSW faxed over the application and scripts on pt's behalf.

## 2018-01-28 NOTE — [facility] Suicide Risk Assessment (Signed)
[facility]   Principal Problem: Major depressive disorder, recurrent severe without psychotic features [facility](HCC) Discharge Diagnoses:  Patient Active Problem List   Diagnosis Date Noted  . Major depressive disorder, recurrent severe without psychotic features (HCC) [F33.2] 01/26/2018    Priority: High  . Cannabis use disorder, moderate, dependence (HCC) [F12.20] 01/26/2018  . Tobacco use disorder [F17.200] 01/25/2018  . Alcohol use disorder, severe, dependence (HCC) [F10.20] 02/16/2015  . Abnormal LFTs [R94.5] 02/07/2015  . AA (alcohol abuse) [F10.10] 02/07/2015  . Allergic rhinitis [J30.9] 02/07/2015  . 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 Acid reflux [K21.9] 02/07/2015  . Difficulty hearing [H91.90] 02/07/2015  . HLD (hyperlipidemia) [E78.5] 02/07/2015  . Recurrent major depression-severe (HCC) [F33.2] 02/07/2015  . Alcohol abuse [F10.10] 02/07/2015  . Panic disorder [F41.0] 02/07/2015    Total Time spent with patient: 20 minutes  Musculoskeletal: Strength & Muscle Tone: within normal limits Gait & Station: normal Patient leans: N/A  Psychiatric Specialty Exam: Review of Systems  Neurological: Negative.   Psychiatric/Behavioral: Positive for substance abuse.  All other systems reviewed and are negative.   Blood pressure 93/60, pulse (!) 110, temperature 97.6 F (36.4 C), temperature source Oral, resp. rate 18, height 6' (1.829 m), weight 92.1 kg (203 lb), SpO2 98 %.Body mass index is 27.53 kg/m.  General Appearance: Casual  Eye Contact::  Good  Speech:  Clear and Coherent409  Volume:  Normal  Mood:  Euthymic  Affect:  Appropriate  Thought Process:  Goal Directed and Descriptions of Associations: Intact  Orientation:  Full (Time, Place, and Person)  Thought Content:  WDL  Suicidal Thoughts:  No  Homicidal Thoughts:  No  Memory:  Immediate;   Fair Recent;   Fair Remote;   Fair  Judgement:  Impaired  Insight:  Shallow  Psychomotor  Activity:  Normal  Concentration:  Fair  Recall:  [employer]Fair  Fund of Knowledge:Fair  Language: Fair  Akathisia:  No  Handed:  Right  AIMS (if indicated):     Assets:  Communication Skills Desire for Improvement Financial Resources/Insurance Housing Physical Health Resilience Social Support Transportation Vocational/Educational  Sleep:  Number of Hours: 7.45  Cognition: WNL  ADL's:  Intact   Mental Status Per Nursing Assessment::   On Admission:  NA(denies)  Demographic Factors:  Male, Divorced or widowed and Living alone  Loss Factors: Loss of significant relationship  Historical Factors: Prior suicide attempts, Family history of mental illness or substance abuse and Impulsivity  Risk Reduction Factors:   Responsible for children under 39 years of age, Sense of responsibility to family, Religious beliefs about death and Positive social support  Continued Clinical Symptoms:  Depression:   Comorbid alcohol abuse/dependence Impulsivity Alcohol/Substance Abuse/Dependencies  Cognitive Features That Contribute To Risk:  None    Suicide Risk:  Minimal: No identifiable suicidal ideation.  Patients presenting with no risk factors but with morbid ruminations; may be classified as minimal risk based on the severity of the depressive symptoms  Follow-up Information    [facility]   Why:  You are to go for your follow-up appointment within 7 days.  Intakes are done Monday through Friday from 9:00AM to 4:00PM.  Please bring ID and all medications taken.  Thank you. Contact information: [address] [place] [address] [phone]           Plan Of Care/Follow-up recommendations:  Activity:  as tolerated Diet:  low sodium heart healthy Other:  keep follow up appointments  Kristine Linea, MD 01/28/2018, 9:08 AM

## 2018-01-28 NOTE — Plan of Care (Signed)
Pt cooperative this evening. Pt denies SI/HI. Pt compliant with medications. Pt requested Vistaril 50 mg at 2032 for anxiety. Pt is receptive to treatment and safety maintained on unit. Will continue to monitor.   Problem: Education: Goal: Knowledge of disease or condition will improve Outcome: Progressing Goal: Understanding of discharge needs will improve Outcome: Progressing   Problem: Physical Regulation: Goal: Complications related to the disease process, condition or treatment will be avoided or minimized Outcome: Progressing   Problem: Safety: Goal: Ability to remain free from injury will improve Outcome: Progressing   Problem: Education: Goal: Utilization of techniques to improve thought processes will improve Outcome: Progressing   Problem: Activity: Goal: Interest or engagement in leisure activities will improve Outcome: Progressing   Problem: Coping: Goal: Coping ability will improve Outcome: Progressing   Problem: Safety: Goal: Ability to disclose and discuss suicidal ideas will improve Outcome: Progressing   Problem: Self-Concept: Goal: Will verbalize positive feelings about self Outcome: Progressing   Problem: Medication: Goal: Compliance with prescribed medication regimen will improve Outcome: Progressing

## 2018-01-28 NOTE — Progress Notes (Signed)
  [facility] :  Will you be returning to the same living situation after discharge:  Yes,  Pt will be returning to his home at discharge. At discharge, do you have transportation home?: Yes,  Pt's mother will be picking him up at discharge. Do you have the ability to pay for your medications: No.  Release of information consent forms completed and in the chart;  Patient's signature needed at discharge.  Patient to Follow up at: Follow-up Information    [facility] Follow [facility].   Why:  You are to go for your follow-up appointment within 7 days.  Intakes are done Monday through Friday from 9:00AM to 4:00PM.  Please bring ID and all medications taken.  Thank you. Contact information: [address] [place] [address] [phone]           Next level of care provider has access to [facility] Link:no  Safety Planning and Suicide Prevention discussed: Yes,  No safety concerns expressed by pt.  Have you used any form of tobacco in the last 30 days? (Cigarettes, Smokeless Tobacco, Cigars, and/or Pipes): Patient Refused Screening(does not want patch at this time)  Has patient been referred to the Quitline?: Patient refused referral  Patient has been referred for addiction treatment: Pt. refused referral  Alease FrameSonya S Elton Catalano, LCSW 01/28/2018, 9:13 AM

## 2018-01-28 NOTE — Discharge Summary (Signed)
Physician Discharge Summary Note  Patient:  Jonathan [name] is an 37 y.o., male MRN:  [id number] DOB:  11/12/1980 Patient phone:  [phone] (home)  Patient address:   [address] [place] [address],  Total Time spent with patient: 20 minutes plus 20 min on care coordination and documantation  Date of Admission:  01/26/2018 Date of Discharge: 01/28/2018  Reason for Admission:  Suicidal ideation.  History of Present Illness:   Identifying data. Jonathan [name] is a 37 year old male with alcoholism and mood instability.   Chief complaint. "I am just a drunk."  History of present illness. Information was obtained from the patient and the chart. The patient came to the ER by the police after he texted his ex-wife to inform her that he was drinking bleach to kill himself. He has been under duress after his divorce was finalized after a long battle and he has not been able to see his daughters. In addition, his girlfriend just left him too. His drinking escalated, he stopped taking Paxil for 3 days and became suicidal. Yesterday in the ER he felt that he needs to be treated for bipolar disorder but today he believes that drinking is really the problem. He has been doing fine on Paxil 60 mg and occasional Valium for panic attacks until recently. He has been trying to curb his drinking and switched from whiskey to beer. He adamantly denies psychotic symptoms or symptoms suggestive of bipolar mania. He has infrequent panic attacks but no other forms of anxiety. He uses cannabis but no other drugs.  Past psychiatric history. He was in rehab once and stayed sober for 3 months. He was at [facility] once. He did well on Paxil but feels it stopped working. No suicide attempts. According to his chart, he has been on Lithium, Remeron and Abilify but he denies.   Family psychiatric history. Mother with depression, anxiety, bipolar on Xanax and Valium.  Social history. He is now divorced and no  longer in the relationship. Has been staying with his parents more. He has a job that he goes to every day and drinks after work.    Principal Problem: Major depressive disorder, recurrent severe without psychotic features [facility]) Discharge Diagnoses: Patient Active Problem List   Diagnosis Date Noted  . Major depressive disorder, recurrent severe without psychotic features (HCC) [F33.2] 01/26/2018    Priority: High  . Cannabis use disorder, moderate, dependence (HCC) [F12.20] 01/26/2018  . Tobacco use disorder [F17.200] 01/25/2018  . Alcohol use disorder, severe, dependence (HCC) [F10.20] 02/16/2015  . Abnormal LFTs [R94.5] 02/07/2015  . AA (alcohol abuse) [F10.10] 02/07/2015  . Allergic rhinitis [J30.9] 02/07/2015  . 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 Acid reflux [K21.9] 02/07/2015  . Difficulty hearing [H91.90] 02/07/2015  . HLD (hyperlipidemia) [E78.5] 02/07/2015  . Recurrent major depression-severe (HCC) [F33.2] 02/07/2015  . Alcohol abuse [F10.10] 02/07/2015  . Panic disorder [F41.0] 02/07/2015    Past Medical History:  Past Medical History:  Diagnosis Date  . Anxiety   . Deaf   . Depression   . Gastric reflux   . Liver disease   . Panic disorder     Past Surgical History:  Procedure Laterality Date  . COLON SURGERY    . ear surgey     Family History:  Family History  Problem Relation Age of Onset  . Diabetes Mother   . Depression Mother   . Anxiety disorder Mother   . COPD Mother   . Heart attack  Father   . Diabetes Father    Social History:  Social History   Substance and Sexual Activity  Alcohol Use Yes  . Alcohol/week: 8.4 oz  . Types: 12 Cans of beer, 2 Shots of liquor per week   Comment: trying to quit last about 2 weeks ago     Social History   Substance and Sexual Activity  Drug Use No    Social History   Socioeconomic History  . Marital status: Married    Spouse name: Not on file  . Number of children: Not on file  . Years  of education: Not on file  . Highest education level: Not on file  Occupational History  . Not on file  Social Needs  . Financial resource strain: Not on file  . Food insecurity:    Worry: Not on file    Inability: Not on file  . Transportation needs:    Medical: Not on file    Non-medical: Not on file  Tobacco Use  . Smoking status: Current Every Day Smoker    Packs/day: 1.00    Types: Cigarettes    Start date: 02/07/2000  . Smokeless tobacco: Current User    Types: Snuff, Chew  Substance and Sexual Activity  . Alcohol use: Yes    Alcohol/week: 8.4 oz    Types: 12 Cans of beer, 2 Shots of liquor per week    Comment: trying to quit last about 2 weeks ago  . Drug use: No  . Sexual activity: Yes    Birth control/protection: None  Lifestyle  . Physical activity:    Days per week: Not on file    Minutes per session: Not on file  . Stress: Not on file  Relationships  . Social connections:    Talks on phone: Not on file    Gets together: Not on file    Attends religious service: Not on file    Active member of club or organization: Not on file    Attends meetings of clubs or organizations: Not on file    Relationship status: Not on file  Other Topics Concern  . Not on file  Social History Narrative  . Not on file    Hospital Course:    Jonathan [name] is a 37 year old male with a history of depression and alcoholism admitted for suicidal ideation in the context of drinking and severe relationship problems. He completed alcohol detox and is ready to follow up with [facility]. At the time of dicharge, he is no longer suicidal. He is able to contract for safety. He is forward thinking and optimistic about the future. He is a loving father and son.  #Mood, improved -continue Paxil at a lower dose 20 mg daily -continue Seroquel 100 mg nightly  -continue Vistaril 50 mg PRN  #Insomnia, resolved  #Alcohol abuse -Librium taper completed -continue Antabuse 250 mg daily     #Substance abuse treatment -declines residential treatment -interested in IOP  #Smoking cessation -nicotine patch was available  #Labs -lipid panel shows elevated Chol and TG, TSH and A1C unremarkable -EKG reviewed, NSR with QTc 440  #Dyslipidemia -Crestor 10 mg  #Disposition -discharge to home with parents -follow up with [facility] for medication management and substance abuse treatment    Physical Findings: AIMS: Facial and Oral Movements Muscles of Facial Expression: None, normal Lips and Perioral Area: None, normal Jaw: None, normal Tongue: None, normal,Extremity Movements Upper (arms, wrists, hands, fingers): None, normal Lower (legs, knees, ankles, toes):  None, normal, Trunk Movements Neck, shoulders, hips: None, normal, Overall Severity Severity of abnormal movements (highest score from questions above): None, normal Incapacitation due to abnormal movements: None, normal Patient's awareness of abnormal movements (rate only patient's report): No Awareness, Dental Status Current problems with teeth and/or dentures?: No Does patient usually wear dentures?: No  CIWA:  CIWA-Ar Total: 0 COWS:     Musculoskeletal: Strength & Muscle Tone: within normal limits Gait & Station: normal Patient leans: N/A  Psychiatric Specialty Exam: Physical Exam  Nursing note and vitals reviewed. Psychiatric: His speech is normal and behavior is normal. Thought content normal. His mood appears anxious. Cognition and memory are normal. He expresses impulsivity.    Review of Systems  Neurological: Negative.   Psychiatric/Behavioral: Positive for substance abuse.  All other systems reviewed and are negative.   Blood pressure 93/60, pulse (!) 110, temperature 97.6 F (36.4 C), temperature source Oral, resp. rate 18, height 6' (1.829 m), weight 92.1 kg (203 lb), SpO2 98 %.Body mass index is 27.53 kg/m.  General Appearance: Casual  Eye Contact:  Good  Speech:  Clear and Coherent   Volume:  Normal  Mood:  Euthymic  Affect:  Appropriate  Thought Process:  Goal Directed and Descriptions of Associations: Intact  Orientation:  Full (Time, Place, and Person)  Thought Content:  WDL  Suicidal Thoughts:  No  Homicidal Thoughts:  No  Memory:  Immediate;   Fair Recent;   Fair Remote;   Fair  Judgement:  Impaired  Insight:  Shallow  Psychomotor Activity:  Normal  Concentration:  Concentration: Fair and Attention Span: Fair  Recall:  FiservFair  Fund of Knowledge:  Fair  Language:  Fair  Akathisia:  No  Handed:  Right  AIMS (if indicated):     Assets:  Communication Skills Desire for Improvement Financial Resources/Insurance Housing Physical Health Resilience Social Support Transportation Vocational/Educational  ADL's:  Intact  Cognition:  WNL  Sleep:  Number of Hours: 7.45     Have you used any form of tobacco in the last 30 days? (Cigarettes, Smokeless Tobacco, Cigars, and/or Pipes): Patient Refused Screening(does not want patch at this time)  Has this patient used any form of tobacco in the last 30 days? (Cigarettes, Smokeless Tobacco, Cigars, and/or Pipes) Yes, Yes, A prescription for an FDA-approved tobacco cessation medication was offered at discharge and the patient refused  Blood Alcohol level:  Lab Results  Component Value Date   ETH 72 (H) 01/24/2018   ETH 15 (H) 10/02/2015    Metabolic Disorder Labs:  Lab Results  Component Value Date   HGBA1C 5.2 01/26/2018   MPG 102.54 01/26/2018   No results found for: PROLACTIN Lab Results  Component Value Date   CHOL 255 (H) 01/26/2018   TRIG 411 (H) 01/26/2018   HDL 38 (L) 01/26/2018   CHOLHDL 6.7 01/26/2018   VLDL UNABLE TO CALCULATE IF TRIGLYCERIDE OVER 400 mg/dL 16/10/960406/07/2018   LDLCALC UNABLE TO CALCULATE IF TRIGLYCERIDE OVER 400 mg/dL 54/09/811906/07/2018    See Psychiatric Specialty Exam and Suicide Risk Assessment completed by Attending Physician prior to discharge.  Discharge destination:  Home  Is  patient on multiple antipsychotic therapies at discharge:  No   Has Patient had three or more failed trials of antipsychotic monotherapy by history:  No  Recommended Plan for Multiple Antipsychotic Therapies: NA  Discharge Instructions    Diet - low sodium heart healthy   Complete by:  As directed    Increase activity slowly  Complete by:  As directed      Allergies as of 01/28/2018   No Known Allergies     Medication List    STOP taking these medications   ARIPiprazole 5 MG tablet Commonly known as:  ABILIFY   lithium carbonate 300 MG CR tablet Commonly known as:  LITHOBID     TAKE these medications     Indication  disulfiram 250 MG tablet Commonly known as:  ANTABUSE Take 1 tablet (250 mg total) by mouth daily.  Indication:  Excessive Use of Alcohol   hydrOXYzine 50 MG tablet Commonly known as:  ATARAX/VISTARIL Take 1 tablet (50 mg total) by mouth 3 (three) times daily as needed for anxiety.  Indication:  Feeling Anxious   PARoxetine 20 MG tablet Commonly known as:  PAXIL Take 1 tablet (20 mg total) by mouth daily. What changed:    medication strength  how much to take  Indication:  Major Depressive Disorder   QUEtiapine 100 MG tablet Commonly known as:  SEROQUEL Take 1 tablet (100 mg total) by mouth at bedtime.  Indication:  Major Depressive Disorder   rosuvastatin 10 MG tablet Commonly known as:  CRESTOR Take 1 tablet (10 mg total) by mouth daily.  Indication:  High Amount of Fats in the Blood      Follow-up Information    [facility], [facility] Follow up.   Why:  You are to go for your follow-up appointment within 7 days.  Intakes are done Monday through Friday from 9:00AM to 4:00PM.  Please bring ID and all medications taken.  Thank you. Contact information: [address] [place] [address] [phone]           Follow-up recommendations:  Activity:  as tolerated Diet:  low sodium heart healthy Other:  keep follow up  appointments  Comments:    Signed: Kristine Linea, MD 01/28/2018, 9:12 AM

## 2018-01-28 NOTE — Discharge Planning (Signed)
Patient alert and oriented. Patient denies SI, HI and AVH. Patient received discharge instructions and verbalized understanding of instructions. Patient received 7 day supply and prescriptions verbalized understanding. Patient received all belongings from search room and locker and verified all items present. Patient escorted to waiting area where mother awaited to take patient home in private vehicle.

## 2019-07-20 DIAGNOSIS — H903 Sensorineural hearing loss, bilateral: Secondary | ICD-10-CM | POA: Insufficient documentation

## 2019-07-20 DIAGNOSIS — Z9621 Cochlear implant status: Secondary | ICD-10-CM | POA: Insufficient documentation

## 2021-03-16 ENCOUNTER — Other Ambulatory Visit: Payer: Self-pay

## 2021-03-16 ENCOUNTER — Emergency Department: Payer: Medicaid Other

## 2021-03-16 ENCOUNTER — Encounter: Payer: Self-pay | Admitting: Emergency Medicine

## 2021-03-16 ENCOUNTER — Emergency Department
Admission: EM | Admit: 2021-03-16 | Discharge: 2021-03-16 | Disposition: A | Discharge status: Home / Self Care | Payer: Medicaid Other | Attending: Emergency Medicine | Admitting: Emergency Medicine

## 2021-03-16 DIAGNOSIS — F17210 Nicotine dependence, cigarettes, uncomplicated: Secondary | ICD-10-CM | POA: Diagnosis not present

## 2021-03-16 DIAGNOSIS — N50812 Left testicular pain: Secondary | ICD-10-CM

## 2021-03-16 DIAGNOSIS — R1030 Lower abdominal pain, unspecified: Secondary | ICD-10-CM | POA: Diagnosis present

## 2021-03-16 DIAGNOSIS — N5082 Scrotal pain: Secondary | ICD-10-CM | POA: Diagnosis not present

## 2021-03-16 DIAGNOSIS — K219 Gastro-esophageal reflux disease without esophagitis: Secondary | ICD-10-CM | POA: Diagnosis not present

## 2021-03-16 LAB — CBC WITH DIFFERENTIAL/PLATELET
Abs Immature Granulocytes: 0.01 10*3/uL (ref 0.00–0.07)
Basophils Absolute: 0.1 10*3/uL (ref 0.0–0.1)
Basophils Relative: 1 %
Eosinophils Absolute: 0.1 10*3/uL (ref 0.0–0.5)
Eosinophils Relative: 1 %
HCT: 46.3 % (ref 39.0–52.0)
Hemoglobin: 16.1 g/dL (ref 13.0–17.0)
Immature Granulocytes: 0 %
Lymphocytes Relative: 22 %
Lymphs Abs: 1.8 10*3/uL (ref 0.7–4.0)
MCH: 31.4 pg (ref 26.0–34.0)
MCHC: 34.8 g/dL (ref 30.0–36.0)
MCV: 90.3 fL (ref 80.0–100.0)
Monocytes Absolute: 0.9 10*3/uL (ref 0.1–1.0)
Monocytes Relative: 11 %
Neutro Abs: 5.4 10*3/uL (ref 1.7–7.7)
Neutrophils Relative %: 65 %
Platelets: 260 10*3/uL (ref 150–400)
RBC: 5.13 MIL/uL (ref 4.22–5.81)
RDW: 12 % (ref 11.5–15.5)
WBC: 8.2 10*3/uL (ref 4.0–10.5)
nRBC: 0 % (ref 0.0–0.2)

## 2021-03-16 LAB — URINALYSIS, COMPLETE (UACMP) WITH MICROSCOPIC
Bacteria, UA: NONE SEEN
Bilirubin Urine: NEGATIVE
Glucose, UA: NEGATIVE mg/dL
Hgb urine dipstick: NEGATIVE
Ketones, ur: NEGATIVE mg/dL
Leukocytes,Ua: NEGATIVE
Nitrite: NEGATIVE
Protein, ur: NEGATIVE mg/dL
Specific Gravity, Urine: 1.002 — ABNORMAL LOW (ref 1.005–1.030)
Squamous Epithelial / HPF: NONE SEEN (ref 0–5)
pH: 7 (ref 5.0–8.0)

## 2021-03-16 LAB — LIPASE, BLOOD: Lipase: 60 U/L — ABNORMAL HIGH (ref 11–51)

## 2021-03-16 LAB — COMPREHENSIVE METABOLIC PANEL
ALT: 26 U/L (ref 0–44)
AST: 20 U/L (ref 15–41)
Albumin: 4.2 g/dL (ref 3.5–5.0)
Alkaline Phosphatase: 59 U/L (ref 38–126)
Anion gap: 10 (ref 5–15)
BUN: 12 mg/dL (ref 6–20)
CO2: 24 mmol/L (ref 22–32)
Calcium: 9.3 mg/dL (ref 8.9–10.3)
Chloride: 102 mmol/L (ref 98–111)
Creatinine, Ser: 1.03 mg/dL (ref 0.61–1.24)
GFR, Estimated: >60 mL/min (ref >60)
Glucose, Bld: 87 mg/dL (ref 70–99)
Potassium: 3.9 mmol/L (ref 3.5–5.1)
Sodium: 136 mmol/L (ref 135–145)
Total Bilirubin: 1 mg/dL (ref 0.3–1.2)
Total Protein: 7 g/dL (ref 6.5–8.1)

## 2021-03-16 MED ORDER — IOHEXOL 350 MG/ML SOLN
75.0000 mL | Freq: Once | INTRAVENOUS | Status: AC | PRN
  Administered 2021-03-16: 75 mL via INTRAVENOUS
  Filled 2021-03-16: qty 75

## 2021-03-16 MED ORDER — MORPHINE SULFATE (PF) 4 MG/ML IV SOLN
4.0000 mg | Freq: Once | INTRAVENOUS | Status: AC
  Administered 2021-03-16: 4 mg via INTRAVENOUS
  Filled 2021-03-16: qty 1

## 2021-03-16 NOTE — ED Notes (Signed)
Patient transported to CT 

## 2021-03-16 NOTE — ED Triage Notes (Signed)
Pt reports pain to left groin for several days. Pt reports MD put him on an abx 2 days ago but the pain is no better.

## 2021-03-16 NOTE — Discharge Instructions (Addendum)
Please use Tylenol or ibuprofen as needed for pain.  Please continue the ciprofloxacin that you were previously prescribed from your primary care.  Please call tomorrow to schedule a evaluation appointment with general surgery.  Please also schedule a close follow-up with your PCP.  Return to the emergency department if you are experiencing worsening of symptoms, fever, chills, nausea or vomiting.  Otherwise, follow-up with your primary care is appropriate.

## 2021-03-16 NOTE — ED Provider Notes (Signed)
[facility] Emergency Department Provider Note  ____________________________________________   Event Date/Time   First MD Initiated Contact with Patient 03/16/21 1214     (approximate)  I have reviewed the triage vital signs and the nursing notes.   HISTORY  Chief Complaint Groin Pain  HPI Jonathan [name] is a 40 y.o. male who presents to the emergency department for evaluation of scrotal pain that has been going on for several days, worse on the left side.  Patient saw his primary care provider on Friday, was initiated on ciprofloxacin for coverage of potential epididymitis with no improvement in his symptoms.  Patient reports his pain is a 10/10 in the scrotum, worse on the left.  He also reports lower abdominal pain.  He denies any nausea, vomiting, constipation, diarrhea, fevers.  He denies any dysuria, but notably is concerned that he is unable to obtain an erection since the pain started.        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 Authorizing Provider  disulfiram  (ANTABUSE) 250 MG tablet Take 1 tablet (250 mg total) by mouth daily. 01/27/18   Pucilowska, Braulio Conte B, MD  hydrOXYzine (ATARAX/VISTARIL) 50 MG tablet Take 1 tablet (50 mg total) by mouth 3 (three) times daily as needed for anxiety. 01/27/18   Pucilowska, Jolanta B, MD  PARoxetine (PAXIL) 20 MG tablet Take 1 tablet (20 mg total) by mouth daily. 01/28/18   Pucilowska, Braulio Conte B, MD  QUEtiapine (SEROQUEL) 100 MG tablet Take 1 tablet (100 mg total) by mouth at bedtime. 01/27/18   Pucilowska, Jolanta B, MD  rosuvastatin (CRESTOR) 10 MG tablet Take 1 tablet (10 mg total) by mouth daily. 01/27/18   Pucilowska, Ellin Goodie, MD    Allergies Patient has no known allergies.  Family History  Problem Relation Age of Onset   Diabetes Mother    Depression Mother    Anxiety disorder Mother    COPD Mother    Heart attack Father    Diabetes Father     Social History Social History   Tobacco Use   Smoking status: Every Day    Packs/day: 1.00    Types: Cigarettes    Start date: 02/07/2000   Smokeless tobacco: Current    Types: Snuff, Chew  Vaping Use   Vaping Use: Never used  Substance Use Topics   Alcohol use: Yes    Alcohol/week: 14.0 standard drinks    Types: 12 Cans of beer, 2 Shots of liquor per week    Comment: trying to quit  last about 2 weeks ago   Drug use: No    Review of Systems Constitutional: No fever/chills Eyes: No visual changes. ENT: No sore throat. Cardiovascular: Denies chest pain. Respiratory: Denies shortness of breath. Gastrointestinal: + abdominal pain.  No nausea, no vomiting.  No diarrhea.  No constipation. Genitourinary: + Scrotal pain Musculoskeletal: Negative for back pain. Skin: Negative for rash. Neurological: Negative for headaches, focal weakness or numbness.  ____________________________________________   PHYSICAL EXAM:  VITAL SIGNS: ED Triage Vitals  Enc Vitals Group     BP 03/16/21 1211 134/80     Pulse Rate 03/16/21 1211 88     Resp 03/16/21 1211  20     Temp 03/16/21 1211 97.8 F (36.6 C)     Temp Source 03/16/21 1211 Oral     SpO2 03/16/21 1211 100 %     Weight 03/16/21 1211 198 lb (89.8 kg)     Height 03/16/21 1211 6' (1.829 m)     Head Circumference --      Peak Flow --      Pain Score 03/16/21 1209 8     Pain Loc --      Pain Edu? --      Excl. in GC? --    Constitutional: Alert and oriented. Well appearing and in no acute distress. Eyes: Conjunctivae are normal. PERRL. EOMI. Head: Atraumatic. Nose: No congestion/rhinnorhea. Mouth/Throat: Mucous membranes are moist.  Oropharynx non-erythematous. Neck: No stridor.   Cardiovascular: Normal rate, regular rhythm. Grossly normal heart sounds.  Good peripheral circulation. Respiratory: Normal respiratory effort.  No retractions. Lungs CTAB. Gastrointestinal: Diffuse tenderness across the lower abdomen, no area of increased tenderness, no peritoneal signs, no guarding or rebound.  No distention. No abdominal bruits. No CVA tenderness.  Of note, the patient does have increased tenderness of the region of the left inguinal canal with no palpable defect. Genitourinary: Chaperoned testicular exam reveals no significant scrotal edema.  There is endorsed tenderness with palpation of the left side of the scrotum.  No area of fluctuance, abscess, or mass able to be palpated.  Musculoskeletal: No lower extremity tenderness nor edema.  No joint effusions. Neurologic:  Normal speech and language. No gross focal neurologic deficits are appreciated. No gait instability. Skin:  Skin is warm, dry and intact. No rash noted. Psychiatric: Mood and affect are normal. Speech and behavior are normal.  ____________________________________________   LABS (all labs ordered are listed, but only abnormal results are displayed)  Labs Reviewed  URINALYSIS, COMPLETE (UACMP) WITH MICROSCOPIC - Abnormal; Notable for the following components:      Result Value   Color, Urine STRAW (*)    APPearance CLEAR  (*)    Specific Gravity, Urine 1.002 (*)    All other components within normal limits  LIPASE, BLOOD - Abnormal; Notable for the following components:   Lipase 60 (*)    All other components within normal limits  CHLAMYDIA/NGC RT PCR ([facility] ONLY)            COMPREHENSIVE METABOLIC PANEL  CBC WITH DIFFERENTIAL/PLATELET   ____________________________________________  RADIOLOGY   Official radiology report(s): CT ABDOMEN PELVIS W CONTRAST  Result Date: 03/16/2021 CLINICAL DATA:  Abdominal pain radiating into left groin for several days. EXAM: CT ABDOMEN AND PELVIS WITH CONTRAST TECHNIQUE: Multidetector CT imaging of the abdomen and pelvis was performed using the standard protocol following bolus administration of intravenous contrast. CONTRAST:  75 mL OMNIPAQUE IOHEXOL 350 MG/ML SOLN COMPARISON:  CT abdomen and pelvis 05/13/2013. FINDINGS:  Lower chest: Lung bases clear.  No pleural or pericardial effusion. Hepatobiliary: No focal liver abnormality is seen. No gallstones, gallbladder wall thickening, or biliary dilatation. Pancreas: Unremarkable. No pancreatic ductal dilatation or surrounding inflammatory changes. Spleen: Normal in size without focal abnormality. Adrenals/Urinary Tract: Adrenal glands are unremarkable. No renal calculi, solid lesion, or hydronephrosis. Very small cyst lower pole left kidney incidentally noted. Bladder is unremarkable. Stomach/Bowel: Stomach is within normal limits. Appendix appears normal. No evidence of bowel wall thickening, distention, or inflammatory changes. Vascular/Lymphatic: Aortic atherosclerosis. No enlarged abdominal or pelvic lymph nodes. Reproductive: The prostate appears mildly enlarged. Other: Small fat containing umbilical hernia. Musculoskeletal: The patient has bilateral L5 pars no acute abnormality or focal lesion interarticularis defects with associated 0.7 cm anterolisthesis L5 on S1. IMPRESSION: No acute abnormality or finding to explain the  patient's symptoms. Mild prostatomegaly. Small fat containing umbilical hernia. 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 Aortic Atherosclerosis (ICD10-I70.0). Electronically Signed   By: Drusilla Kanner M.D.   On: 03/16/2021 14:34   US SCROTUM W/DOPPLER  Result Date: 03/16/2021 CLINICAL DATA:  Left testicular pain for 5 days EXAM: SCROTAL ULTRASOUND DOPPLER ULTRASOUND OF THE TESTICLES TECHNIQUE: Complete ultrasound examination of the testicles, epididymis, and other scrotal structures was performed. Color and spectral Doppler ultrasound were also utilized to evaluate blood flow to the testicles. COMPARISON:  None. FINDINGS: Right testicle Measurements: 4.4 x 2.1 x 3.3 cm. No mass or microlithiasis visualized. Left testicle Measurements: 4.4 x 2.2 x 3.0 cm. No mass or microlithiasis visualized. Right epididymis:  Normal in size and appearance. Left epididymis:  4 mm cyst Hydrocele:  None visualized. Varicocele:  Bilateral, left more prominent than the right. Pulsed Doppler interrogation of both testes demonstrates normal low resistance arterial and venous waveforms bilaterally. Question fat containing left inguinal hernia. IMPRESSION: No testicular abnormality.  No evidence of torsion. Bilateral varicoceles, left more prominent than right. Question left inguinal hernia containing fat. Electronically Signed   By: Charlett Nose M.D.   On: 03/16/2021 13:44    _ ____________________________________________   INITIAL IMPRESSION / ASSESSMENT AND PLAN / ED COURSE  As part of my medical decision making, I reviewed the following data within the electronic MEDICAL RECORD NUMBER Nursing notes reviewed and incorporated, Labs reviewed, discussed with attending Dr. Cyril Loosen, and Notes from prior ED visits        Patient is a 40 year old male who presents to the emergency department for evaluation of scrotal pain, worse on the left side that he rates a 10/10 with  associated lower abdominal pain.  See HPI for further details.  In triage patient has normal vital signs.  On physical exam, patient does have diffuse tenderness across the lower abdomen with no peritoneal signs.  He also has increased tenderness of the left inguinal canal region with no palpable deformities or masses.  Chaperoned scrotal exam is without significant abnormalities.  Labs were obtained with CBC, CMP, urinalysis, lipase.  There is very minimal increase in lipase, likely related to the patient's known alcohol use, otherwise labs are grossly unremarkable.  Ultrasound was obtained and demonstrates varicocele, worse on the left as well as possible fat-containing hernia on the left.  CT abdomen and pelvis with contrast was then obtained and is negative for any acute hernia or other obvious infectious source.  Differentials considered include epididymitis, UTI, testicular mass, testicular torsion, foreign years, other scrotal infection, hernia with or without incarceration, other.  Overall, the patient's work-up is  grossly benign, physical exam overall reassuring.  Advised the patient to continue his use of the ciprofloxacin that he was previously prescribed in case this could be skewing the results of his urinalysis or other test.  The patient was advised to follow-up with general surgery, as I believe this is likely a spontaneously reducing left inguinal hernia that could be contributing to his symptoms.  The patient is amenable with outpatient general surgery work-up.  Return precautions were discussed at length with the patient, particularly for any fever, chills, worsening pain or swelling or other changes.  Case was discussed with attending Dr. Cyril Loosen who agrees with the above work-up and plan of care.      ____________________________________________   FINAL CLINICAL IMPRESSION(S) / ED DIAGNOSES  Final diagnoses:  Scrotal pain     ED Discharge Orders     None         Note:  This document was prepared using Dragon voice recognition software and may include unintentional dictation errors.    Lucy Chris, PA 03/16/21 [facility]    Jene Every, MD 03/16/21 [phone]

## 2021-03-16 NOTE — ED Notes (Signed)
Patient transported to Ultrasound 

## 2021-03-17 LAB — CHLAMYDIA/NGC RT PCR (ARMC ONLY)
Chlamydia Tr: NOT DETECTED
N gonorrhoeae: NOT DETECTED

## 2021-03-18 ENCOUNTER — Ambulatory Visit: Payer: Medicaid Other | Admitting: General Surgery

## 2021-03-18 ENCOUNTER — Encounter: Payer: Self-pay | Admitting: General Surgery

## 2021-03-18 ENCOUNTER — Other Ambulatory Visit: Payer: Self-pay

## 2021-03-18 VITALS — BP 134/92 | HR 87 | Temp 98.2°F | Ht 72.0 in | Wt 194.4 lb

## 2021-03-18 DIAGNOSIS — N529 Male erectile dysfunction, unspecified: Secondary | ICD-10-CM | POA: Diagnosis not present

## 2021-03-18 DIAGNOSIS — N451 Epididymitis: Secondary | ICD-10-CM | POA: Diagnosis not present

## 2021-03-18 NOTE — Patient Instructions (Addendum)
Finish all of your antibiotics. Follow up with your primary care doctor for the Epididymitis to be sure you are treated properly for this. You may need a referral to Urology.  You may want to ask primary care about elevated heart rate if you have any concerns.  We did not feel a hernia on either side of your groin during exam. The only hernia found was an umbilical hernia.    Epididymitis  Epididymitis is swelling (inflammation) or infection of the epididymis. The epididymis is a cord-like structure that is located along the top and back part of the testicle. It collects and storessperm from the testicle. This condition can also cause pain and swelling of the testicle and scrotum. Symptoms usually start suddenly (acute epididymitis). Sometimes epididymitis starts gradually and lasts for a while (chronic epididymitis). This type may be harder to treat. What are the causes? In men ages 65-40, this condition is usually caused by a bacterial infection or a sexually transmitted disease (STD), such as: Gonorrhea. Chlamydia. In men 1 and older who do not have anal sex, this condition is usually caused by bacteria from a blockage or from abnormalities in the urinary system. These can result from: Having a tube placed into the bladder (urinary catheter). Having an enlarged or inflamed prostate gland. Having recently had urinary tract surgery. Having a problem with a backward flow of urine (retrograde). In men who have a condition that weakens the body's defense system (immune system), such as HIV, this condition can be caused by: Other bacteria, including tuberculosis and syphilis. Viruses. Fungi. Sometimes this condition occurs without infection. This may happen because oftrauma or repetitive activities such as sports. What increases the risk? You are more likely to develop this condition if you have: Unprotected sex with more than one partner. Anal sex. Recently had surgery. A urinary  catheter. Urinary problems. A suppressed immune system. What are the signs or symptoms? This condition usually begins suddenly with chills, fever, and pain behind the scrotum and in the testicle. Other symptoms include: Swelling of the scrotum, testicle, or both. Pain when ejaculating or urinating. Pain in the back or abdomen. Nausea. Itching and discharge from the penis. A frequent need to pass urine. Redness, increased warmth, and tenderness of the scrotum. How is this diagnosed? Your health care provider can diagnose this condition based on your symptoms and medical history. Your health care provider will also do a physical exam to ask about your symptoms and check your scrotum and testicle for swelling, pain, and redness. You may also have other tests, including: Examination of discharge from the penis. Urine tests for infections, such as STDs. Ultrasound test for blood flow and inflammation. Your health care provider may test you for other STDs, including HIV. How is this treated? Treatment for this condition depends on the cause. If your condition is caused by a bacterial infection, oral antibiotic medicine may be prescribed. If the bacterial infection has spread to your blood, you may need to receive IVantibiotics. For both bacterial and nonbacterial epididymitis, you may be treated with: Rest. Elevation of the scrotum. Pain medicines. Anti-inflammatory medicines. Surgery may be needed to treat: Bacterial epididymitis that causes pus to build up in the scrotum (abscess). Chronic epididymitis that has not responded to other treatments. Follow these instructions at home: Medicines Take over-the-counter and prescription medicines only as told by your health care provider. If you were prescribed an antibiotic medicine, take it as told by your health care provider. Do not stop taking  the antibiotic even if your condition improves. Sexual activity If your epididymitis was caused by  an STD, avoid sexual activity until your treatment is complete. Inform your sexual partner or partners if you test positive for an STD. They may need to be treated. Do not engage in sexual activity with your partner or partners until their treatment is completed. Managing pain and swelling  If directed, elevate your scrotum and apply ice. Put ice in a plastic bag. Place a small towel or pillow between your legs. Rest your scrotum on the pillow or towel. Place another towel between your skin and the plastic bag. Leave the ice on for 20 minutes, 2-3 times a day. Try taking a sitz bath to help with discomfort. This is a warm water bath that is taken while you are sitting down. The water should only come up to your hips and should cover your buttocks. Do this 3-4 times per day or as told by your health care provider. Keep your scrotum elevated and supported while resting. Ask your health care provider if you should wear a scrotal support, such as a jockstrap. Wear it as told by your health care provider.  General instructions Return to your normal activities as told by your health care provider. Ask your health care provider what activities are safe for you. Drink enough fluid to keep your urine pale yellow. Keep all follow-up visits as told by your health care provider. This is important. Contact a health care provider if: You have a fever. Your pain medicine is not helping. Your pain is getting worse. Your symptoms do not improve within 3 days. Summary Epididymitis is swelling (inflammation) or infection of the epididymis. This condition can also cause pain and swelling of the testicle and scrotum. Treatment for this condition depends on the cause. If your condition is caused by a bacterial infection, oral antibiotic medicine may be prescribed. Inform your sexual partner or partners if you test positive for an STD. They may need to be treated. Do not engage in sexual activity with your partner  or partners until their treatment is completed. Contact a health care provider if your symptoms do not improve within 3 days. This information is not intended to replace advice given to you by your health care provider. Make sure you discuss any questions you have with your healthcare provider. Document Revised: 06/06/2018 Document Reviewed: 06/07/2018 Elsevier Patient Education  2022 [employer]  Umbilical Hernia, Adult  A hernia is a bulge of tissue that pushes through an opening between muscles. An umbilical hernia happens in the abdomen, near the belly button (umbilicus). The hernia may contain tissues from the small intestine, large intestine, or fatty tissue covering the intestines (omentum). Umbilical hernias in adults tend to get worse over time, and they requiresurgical treatment. There are several types of umbilical hernias. You may have: A hernia located just above or below the umbilicus (indirect hernia). This is the most common type of umbilical hernia in adults. A hernia that forms through an opening formed by the umbilicus (direct hernia). A hernia that comes and goes (reducible hernia). A reducible hernia may be visible only when you strain, lift something heavy, or cough. This type of hernia can be pushed back into the abdomen (reduced). A hernia that traps abdominal tissue inside the hernia (incarcerated hernia). This type of hernia cannot be reduced. A hernia that cuts off blood flow to the tissues inside the hernia (strangulated hernia). The tissues can start to die if  this happens. This type of hernia requires emergency treatment. What are the causes? An umbilical hernia happens when tissue inside the abdomen presses on a weakarea of the abdominal muscles. What increases the risk? You may have a greater risk of this condition if you: Are obese. Have had several pregnancies. Have a buildup of fluid inside your abdomen (ascites). Have had surgery that weakens the abdominal  muscles. What are the signs or symptoms? The main symptom of this condition is a painless bulge at or near the belly button. A reducible hernia may be visible only when you strain, lift something heavy, or cough. Other symptoms may include: Dull pain. A feeling of pressure. Symptoms of a strangulated hernia may include: Pain that gets increasingly worse. Nausea and vomiting. Pain when pressing on the hernia. Skin over the hernia becoming red or purple. Constipation. Blood in the stool. How is this diagnosed? This condition may be diagnosed based on: A physical exam. You may be asked to cough or strain while standing. These actions increase the pressure inside your abdomen and force the hernia through the opening in your muscles. Your health care provider may try to reduce the hernia by pressing on it. Your symptoms and medical history. How is this treated? Surgery is the only treatment for an umbilical hernia. Surgery for a strangulated hernia is done as soon as possible. If you have a small herniathat is not incarcerated, you may need to lose weight before having surgery. Follow these instructions at home: Lose weight, if told by your health care provider. Do not try to push the hernia back in. Watch your hernia for any changes in color or size. Tell your health care provider if any changes occur. You may need to avoid activities that increase pressure on your hernia. Do not lift anything that is heavier than 10 lb (4.5 kg) until your health care provider says that this is safe. Take over-the-counter and prescription medicines only as told by your health care provider. Keep all follow-up visits as told by your health care provider. This is important. Contact a health care provider if: Your hernia gets larger. Your hernia becomes painful. Get help right away if: You develop sudden, severe pain near the area of your hernia. You have pain as well as nausea or vomiting. You have pain and  the skin over your hernia changes color. You develop a fever. This information is not intended to replace advice given to you by your health care provider. Make sure you discuss any questions you have with your healthcare provider. Document Revised: 09/15/2017 Document Reviewed: 02/01/2017 Elsevier Patient Education  2021 [employer].

## 2021-03-18 NOTE — Progress Notes (Signed)
Patient ID: Jonathan [name], male   DOB: Nov 04, 1980, 40 y.o.   MRN: [id number]  Chief Complaint  Patient presents with   New Patient (Initial Visit)    HPI Jonathan [name] is a 40 y.o. male.   He is here today as a referral from the emergency department.  On July 29 he was seen in his primary care provider's office due to acute onset of bilateral testicular pain.  The note from that office indicates that the pain had started roughly 2 days prior.  There was no association with heavy lifting or exertion.  He did not experience dysuria or urinary frequency.  The pain was worse with any type of movement and there were no significant alleviating factors.  A urinalysis was performed which was unremarkable.  No mass or hernia was appreciated on their exam.  He was presumptively prescribed a fluoroquinolone for possible epididymitis.  2 days later, he was still experiencing persistent pain, which he described as 10 out of 10 when he presented to the emergency department that day.  He was also experiencing lower abdominal pain.  He denied any penile discharge.  Evaluation in the emergency department included lab work as well as a CT scan of the abdomen pelvis and a scrotal ultrasound.  None of these studies were particularly revealing although there was some question of a possible fat-containing in the left inguinal canal.  The provider who saw him diagnosed him with a left inguinal hernia that she felt was spontaneously reducing and referred him to general surgery for further evaluation and treatment.  Today, the patient states that he is no longer experiencing any pain.  He has never noticed a bulge in either inguinal canal.  He is urinating without difficulty.  He denies any fevers or chills.  No nausea or vomiting.  He is eating normally and having normal bowel function.  One of his primary concerns today is that he has been unable to obtain or maintain an erection for the past year.    Past Medical History:   Diagnosis Date   Alcohol abuse    Anxiety    Deaf    Depression    Gastric reflux    Liver disease    Panic disorder     Past Surgical History:  Procedure Laterality Date   COCHLEAR IMPLANT Right    COLON SURGERY      Family History  Problem Relation Age of Onset   Diabetes Mother    Depression Mother    Anxiety disorder Mother    COPD Mother    Heart attack Father    Diabetes Father     Social History Social History   Tobacco Use   Smoking status: Every Day    Packs/day: 1.00    Types: Cigarettes    Start date: 02/07/2000   Smokeless tobacco: Current    Types: Snuff, Chew  Vaping Use   Vaping Use: Some days  Substance Use Topics   Alcohol use: Yes    Alcohol/week: 14.0 standard drinks    Types: 12 Cans of beer, 2 Shots of liquor per week    Comment: trying to quit last about 2 weeks ago   Drug use: No    No Known Allergies  Current Outpatient Medications  Medication Sig Dispense Refill   busPIRone (BUSPAR) 30 MG tablet Take 30 mg by mouth 2 (two) times daily.     cetirizine (ZYRTEC) 10 MG tablet Take by mouth.  ciprofloxacin (CIPRO) 250 MG tablet Take 250 mg by mouth 2 (two) times daily.     rosuvastatin (CRESTOR) 10 MG tablet Take 1 tablet (10 mg total) by mouth daily. 30 tablet 1   No current facility-administered medications for this visit.    Review of Systems Review of Systems  Genitourinary:  Positive for testicular pain.  All other systems reviewed and are negative. Or as discussed in the history of present illness.  Blood pressure (!) 134/92, pulse 87, temperature 98.2 F (36.8 C), height 6' (1.829 m), weight 194 lb 6.4 oz (88.2 kg), SpO2 97 %. Body mass index is 26.37 kg/m.  Physical Exam Physical Exam Vitals reviewed. Exam conducted with a chaperone present.  Constitutional:      General: He is not in acute distress.    Appearance: Normal appearance.  HENT:     Head:     Comments: Bilateral hearing aids with a cochlear  implant on the right.    Nose:     Comments: Covered with a mask    Mouth/Throat:     Comments: Covered with a mask Eyes:     General: No scleral icterus.       Right eye: No discharge.        Left eye: No discharge.  Neck:     Comments: No palpable cervical or supraclavicular lymphadenopathy.  The trachea is midline.  No thyromegaly or dominant thyroid masses appreciated.  The gland moves freely with deglutition. Cardiovascular:     Rate and Rhythm: Normal rate and regular rhythm.     Pulses: Normal pulses.  Pulmonary:     Effort: Pulmonary effort is normal.     Breath sounds: Normal breath sounds.  Abdominal:     General: Bowel sounds are normal.     Palpations: Abdomen is soft.  Genitourinary:    Penis: Normal and circumcised.      Testes:        Right: Tenderness present.        Left: Tenderness present.     Comments: I did not appreciate a hernia in either inguinal canal, despite multiple provocative attempts.  Both testicles were tender to palpation, but he says this is a significant improvement from prior. Musculoskeletal:        General: No swelling or tenderness.  Skin:    General: Skin is warm and dry.  Neurological:     General: No focal deficit present.     Mental Status: He is alert.  Psychiatric:        Mood and Affect: Mood normal.        Behavior: Behavior normal.    Data Reviewed I reviewed the primary care provider's note as discussed in history of present illness.  I also personally viewed the imaging from his emergency department visit.  There is a tiny umbilical hernia seen on CT scan, no inguinal hernia seen. The testicles appeared relatively normal, but bilateral varicoceles were seen. I have copied both reports here:  CLINICAL DATA:  Abdominal pain radiating into left groin for several days.   EXAM: CT ABDOMEN AND PELVIS WITH CONTRAST   TECHNIQUE: Multidetector CT imaging of the abdomen and pelvis was performed using the standard protocol  following bolus administration of intravenous contrast.   CONTRAST:  75 mL OMNIPAQUE IOHEXOL 350 MG/ML SOLN   COMPARISON:  CT abdomen and pelvis 05/13/2013.   FINDINGS: Lower chest: Lung bases clear.  No pleural or pericardial effusion.   Hepatobiliary: No focal liver  abnormality is seen. No gallstones, gallbladder wall thickening, or biliary dilatation.   Pancreas: Unremarkable. No pancreatic ductal dilatation or surrounding inflammatory changes.   Spleen: Normal in size without focal abnormality.   Adrenals/Urinary Tract: Adrenal glands are unremarkable. No renal calculi, solid lesion, or hydronephrosis. Very small cyst lower pole left kidney incidentally noted. Bladder is unremarkable.   Stomach/Bowel: Stomach is within normal limits. Appendix appears normal. No evidence of bowel wall thickening, distention, or inflammatory changes.   Vascular/Lymphatic: Aortic atherosclerosis. No enlarged abdominal or pelvic lymph nodes.   Reproductive: The prostate appears mildly enlarged.   Other: Small fat containing umbilical hernia.   Musculoskeletal: The patient has bilateral L5 pars no acute abnormality or focal lesion interarticularis defects with associated 0.7 cm anterolisthesis L5 on S1.   IMPRESSION: No acute abnormality or finding to explain the patient's symptoms.   Mild prostatomegaly.   Small fat containing umbilical hernia.   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  CLINICAL DATA:  Left testicular pain for 5 days   EXAM: SCROTAL ULTRASOUND   DOPPLER ULTRASOUND OF THE TESTICLES   TECHNIQUE: Complete ultrasound examination of the testicles, epididymis, and other scrotal structures was performed. Color and spectral Doppler ultrasound were also utilized to evaluate blood flow to the testicles.   COMPARISON:  None.   FINDINGS: Right testicle   Measurements: 4.4 x 2.1 x 3.3 cm. No mass or  microlithiasis visualized.   Left testicle   Measurements: 4.4 x 2.2 x 3.0 cm. No mass or microlithiasis visualized.   Right epididymis:  Normal in size and appearance.   Left epididymis:  4 mm cyst   Hydrocele:  None visualized.   Varicocele:  Bilateral, left more prominent than the right.   Pulsed Doppler interrogation of both testes demonstrates normal low resistance arterial and venous waveforms bilaterally.   Question fat containing left inguinal hernia.   IMPRESSION: No testicular abnormality.  No evidence of torsion.   Bilateral varicoceles, left more prominent than right.   Question left inguinal hernia containing fat.  Assessment 39 y/o M with bilateral testicular pain, improving on antibiotics.  No hernia appreciated on exam, although one was questioned on the left.  His symptoms frankly do not seem consistent with a symptomatic hernia and as his job involves extensive strenuous lifting, I would have anticipated that there would be more discomfort or symptomatology affiliated with his vigorous activity.  Plan As he seems to be improving, I have encouraged him to continue and complete his course of antibiotics.  Since one of his primary complaints today was erectile dysfunction, we have placed a referral to urology for evaluation of this entity.  I will see him on an as-needed basis.    Duanne Guess 03/18/2021, 1:51 PM

## 2021-04-14 NOTE — Progress Notes (Signed)
04/15/21 4:32 PM   Jonathan [name] [birth year] [id number]  Referring provider:  Duanne Guess, MD [address] [place],  [place] [address] Chief Complaint  Patient presents with   Erectile Dysfunction     HPI: Jonathan [name] is a 40 y.o.male who presents today for further evaluation of erectile dysfunction.   He was seen in the ED 03/16/2021 for evaluation of scrotal pain, predominately on the left side. He was seen by his PCP who prescribed ciprofloxacin for coverage of potential epididymis with no improvement of symptoms. He also was experiencing lower abdominal pain. Scrotal ultrasound showed no testicular abnormality, no evidence of torsion, and bilateral varicoceles, left more prominent than right. CT of abdomen and pelvis with contrast showed no acute abnormality or finding to explain the patient's symptoms, mild prostatomegaly, small fat containing umbilical hernia,, and bilateral L5 pars interarticularis defects with associated 0.7 cm anterolisthesis L5 on S1.   He followed up with general surgeon Dr. Duanne Guess during this visit no hernia appreciated on exam, although one was questioned on the left.  His symptoms frankly did not seem consistent with a symptomatic hernia. One of his primary complaints was erectile dysfunction, a referral was placed to urology for evaluation.    He states today that he is no longer experiencing scrotal pain. His primary concern today is his ED. He states he does not wake up with erection and this started this year. He has difficulty initiating and keeping erection. He does still have the urge for sexual intercourse. He is a smoker.   He states that heart disease runs in his family.    SHIM     Row Name 04/15/21 1610         SHIM: Over the last 6 months:   How do you rate your confidence that you could get and keep an erection? Low     When you had erections with sexual stimulation, how often were your erections hard enough for  penetration (entering your partner)? A Few Times (much less than half the time)     During sexual intercourse, how often were you able to maintain your erection after you had penetrated (entered) your partner? Sometimes (about half the time)     During sexual intercourse, how difficult was it to maintain your erection to completion of intercourse? Very Difficult     When you attempted sexual intercourse, how often was it satisfactory for you? A Few Times (much less than half the time)           SHIM Total Score   SHIM 11               PMH: Past Medical History:  Diagnosis Date   Alcohol abuse    Anxiety    Deaf    Depression    Gastric reflux    Liver disease    Panic disorder     Surgical History: Past Surgical History:  Procedure Laterality Date   COCHLEAR IMPLANT Right    COLON SURGERY      Home Medications:  Allergies as of 04/15/2021   No Known Allergies      Medication List        Accurate as of April 15, 2021  4:32 PM. If you have any questions, ask your nurse or doctor.          STOP taking these medications    ciprofloxacin 250 MG tablet Commonly known as: CIPRO Stopped by: Vanna Scotland,  MD       TAKE these medications    busPIRone 30 MG tablet Commonly known as: BUSPAR Take 30 mg by mouth 2 (two) times daily.   cetirizine 10 MG tablet Commonly known as: ZYRTEC Take by mouth.   rosuvastatin 10 MG tablet Commonly known as: Crestor Take 1 tablet (10 mg total) by mouth daily.   sildenafil 20 MG tablet Commonly known as: REVATIO Take 3-5 tablets as needed one hour prior to intercourse Started by: Vanna Scotland, MD        Allergies: No Known Allergies  Family History: Family History  Problem Relation Age of Onset   Diabetes Mother    Depression Mother    Anxiety disorder Mother    COPD Mother    Heart attack Father    Diabetes Father     Social History:  reports that he has been smoking cigarettes. He started  smoking about 21 years ago. He has been smoking an average of 1 pack per day. His smokeless tobacco use includes snuff and chew. He reports current alcohol use of about 14.0 standard drinks per week. He reports that he does not use drugs.   Physical Exam: BP 121/73   Pulse 83   Ht 6' (1.829 m)   Wt 194 lb (88 kg)   BMI 26.31 kg/m   Constitutional:  Alert and oriented, No acute distress. HEENT: [place] AT, moist mucus membranes.  Trachea midline, no masses. Cardiovascular: No clubbing, cyanosis, or edema. Respiratory: Normal respiratory effort, no increased work of breathing. Skin: No rashes, bruises or suspicious lesions. Neurologic: Grossly intact, no focal deficits, moving all 4 extremities. Psychiatric: Normal mood and affect.  Laboratory Data:  Lab Results  Component Value Date   CREATININE 1.03 03/16/2021   Lab Results  Component Value Date   HGBA1C 5.2 01/26/2018    Urinalysis Negative   Assessment & Plan:   Scrotal pain - Resolved  2. Erectile Dysfunction  - Prescribed sildenafil, discussed risk and benefits along with possible side effects.  We will use 20 mg tablets up to 100 mg at a time.  He will let us know if this not effective.  3. Smoking  -Can be reasoning for his erectile dysfunction  - Urged him to cease smoking affects the microvascular circulation  - He has a family history of heart disease - Should follow-up with his PCP to discuss whether or not cardiology evaluation is warranted, as multiple risk factors with fairly young onset erectile dysfunction which may be early indication of underlying cardiovascular disease  Return if symptoms worsen or fail to improve.  I,Kailey Littlejohn,acting as a Neurosurgeon for Vanna Scotland, MD.,have documented all relevant documentation on the behalf of Vanna Scotland, MD,as directed by  Vanna Scotland, MD while in the presence of Vanna Scotland, MD.   [facility] [address], [address] [place], [place] [address] [phone]

## 2021-04-15 ENCOUNTER — Encounter: Payer: Self-pay | Admitting: Urology

## 2021-04-15 ENCOUNTER — Ambulatory Visit (INDEPENDENT_AMBULATORY_CARE_PROVIDER_SITE_OTHER): Payer: Medicaid Other | Admitting: Urology

## 2021-04-15 ENCOUNTER — Other Ambulatory Visit: Payer: Self-pay

## 2021-04-15 VITALS — BP 121/73 | HR 83 | Ht 72.0 in | Wt 194.0 lb

## 2021-04-15 DIAGNOSIS — F17200 Nicotine dependence, unspecified, uncomplicated: Secondary | ICD-10-CM | POA: Diagnosis not present

## 2021-04-15 DIAGNOSIS — N451 Epididymitis: Secondary | ICD-10-CM | POA: Diagnosis not present

## 2021-04-15 DIAGNOSIS — N5203 Combined arterial insufficiency and corporo-venous occlusive erectile dysfunction: Secondary | ICD-10-CM

## 2021-04-15 MED ORDER — SILDENAFIL CITRATE 20 MG PO TABS: ORAL_TABLET | ORAL | 6 refills | Status: DC

## 2021-04-16 LAB — MICROSCOPIC EXAMINATION

## 2021-04-16 LAB — URINALYSIS, COMPLETE
Bilirubin, UA: NEGATIVE
Leukocytes,UA: NEGATIVE
Nitrite, UA: NEGATIVE
RBC, UA: NEGATIVE
Specific Gravity, UA: >1.03 — ABNORMAL HIGH (ref 1.005–1.030)
Urobilinogen, Ur: 0.2 mg/dL (ref 0.2–1.0)
pH, UA: 5 (ref 5.0–7.5)

## 2021-05-14 ENCOUNTER — Encounter: Payer: Self-pay | Admitting: General Surgery

## 2021-07-17 ENCOUNTER — Encounter: Payer: Self-pay | Admitting: Emergency Medicine

## 2021-07-17 ENCOUNTER — Emergency Department
Admission: EM | Admit: 2021-07-17 | Discharge: 2021-07-17 | Disposition: A | Discharge status: Other Acute Inpatient Hospital | Payer: Medicaid Other | Attending: Emergency Medicine | Admitting: Emergency Medicine

## 2021-07-17 DIAGNOSIS — Y907 Blood alcohol level of 200-239 mg/100 ml: Secondary | ICD-10-CM | POA: Diagnosis not present

## 2021-07-17 DIAGNOSIS — F1220 Cannabis dependence, uncomplicated: Secondary | ICD-10-CM | POA: Diagnosis present

## 2021-07-17 DIAGNOSIS — Z046 Encounter for general psychiatric examination, requested by authority: Secondary | ICD-10-CM | POA: Diagnosis not present

## 2021-07-17 DIAGNOSIS — Z20822 Contact with and (suspected) exposure to covid-19: Secondary | ICD-10-CM | POA: Diagnosis not present

## 2021-07-17 DIAGNOSIS — F17200 Nicotine dependence, unspecified, uncomplicated: Secondary | ICD-10-CM | POA: Diagnosis present

## 2021-07-17 DIAGNOSIS — R45851 Suicidal ideations: Secondary | ICD-10-CM | POA: Diagnosis not present

## 2021-07-17 DIAGNOSIS — F1020 Alcohol dependence, uncomplicated: Secondary | ICD-10-CM

## 2021-07-17 DIAGNOSIS — F1010 Alcohol abuse, uncomplicated: Secondary | ICD-10-CM | POA: Diagnosis not present

## 2021-07-17 DIAGNOSIS — F332 Major depressive disorder, recurrent severe without psychotic features: Secondary | ICD-10-CM | POA: Diagnosis present

## 2021-07-17 DIAGNOSIS — F17210 Nicotine dependence, cigarettes, uncomplicated: Secondary | ICD-10-CM | POA: Insufficient documentation

## 2021-07-17 DIAGNOSIS — E782 Mixed hyperlipidemia: Secondary | ICD-10-CM | POA: Diagnosis present

## 2021-07-17 DIAGNOSIS — E785 Hyperlipidemia, unspecified: Secondary | ICD-10-CM | POA: Diagnosis present

## 2021-07-17 DIAGNOSIS — H9190 Unspecified hearing loss, unspecified ear: Secondary | ICD-10-CM

## 2021-07-17 DIAGNOSIS — F410 Panic disorder [episodic paroxysmal anxiety] without agoraphobia: Secondary | ICD-10-CM | POA: Diagnosis present

## 2021-07-17 LAB — COMPREHENSIVE METABOLIC PANEL
ALT: 47 U/L — ABNORMAL HIGH (ref 0–44)
AST: 37 U/L (ref 15–41)
Albumin: 4.7 g/dL (ref 3.5–5.0)
Alkaline Phosphatase: 74 U/L (ref 38–126)
Anion gap: 5 (ref 5–15)
BUN: 11 mg/dL (ref 6–20)
CO2: 24 mmol/L (ref 22–32)
Calcium: 9 mg/dL (ref 8.9–10.3)
Chloride: 110 mmol/L (ref 98–111)
Creatinine, Ser: 1.14 mg/dL (ref 0.61–1.24)
GFR, Estimated: >60 mL/min (ref >60)
Glucose, Bld: 120 mg/dL — ABNORMAL HIGH (ref 70–99)
Potassium: 4.5 mmol/L (ref 3.5–5.1)
Sodium: 139 mmol/L (ref 135–145)
Total Bilirubin: 0.6 mg/dL (ref 0.3–1.2)
Total Protein: 8.1 g/dL (ref 6.5–8.1)

## 2021-07-17 LAB — CBC
HCT: 56.4 % — ABNORMAL HIGH (ref 39.0–52.0)
Hemoglobin: 19.3 g/dL — ABNORMAL HIGH (ref 13.0–17.0)
MCH: 31.3 pg (ref 26.0–34.0)
MCHC: 34.2 g/dL (ref 30.0–36.0)
MCV: 91.4 fL (ref 80.0–100.0)
Platelets: 302 10*3/uL (ref 150–400)
RBC: 6.17 MIL/uL — ABNORMAL HIGH (ref 4.22–5.81)
RDW: 12.6 % (ref 11.5–15.5)
WBC: 6.9 10*3/uL (ref 4.0–10.5)
nRBC: 0 % (ref 0.0–0.2)

## 2021-07-17 LAB — ACETAMINOPHEN LEVEL: Acetaminophen (Tylenol), Serum: <10 ug/mL — ABNORMAL LOW (ref 10–30)

## 2021-07-17 LAB — SALICYLATE LEVEL: Salicylate Lvl: <7 mg/dL — ABNORMAL LOW (ref 7.0–30.0)

## 2021-07-17 LAB — RESP PANEL BY RT-PCR (FLU A&B, COVID) ARPGX2
Influenza A by PCR: NEGATIVE
Influenza B by PCR: NEGATIVE
SARS Coronavirus 2 by RT PCR: NEGATIVE

## 2021-07-17 LAB — ETHANOL: Alcohol, Ethyl (B): 228 mg/dL — ABNORMAL HIGH (ref <10)

## 2021-07-17 NOTE — ED Provider Notes (Signed)
[facility] Emergency Department Provider Note   ____________________________________________   Event Date/Time   First MD Initiated Contact with Patient 07/17/21 [phone]     (approximate)  I have reviewed the triage vital signs and the nursing notes.   HISTORY  Chief Complaint Suicidal    HPI Jonathan [name] is a 40 y.o. male with past medical history of hyperlipidemia, alcohol abuse, major depressive disorder, and deafness who presents to the ED for suicidal ideation.  Per IVC paperwork, patient had a gun in the home and had fired it, was then making threats that he wanted to end his life with his girlfriend and mother in the home.  He was involved in a 6 to 7-hour standoff with police, eventually taken into custody and placed under IVC.  Patient now states that he does not remember what happened, reports "I woke up in the police were in my home."  She admits to alcohol consumption this evening but is unable to state how much, denies any drug use.  He had admitted to suicidal ideation but denies homicidal ideation.        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 Authorizing Provider  busPIRone (BUSPAR) 30 MG tablet Take 30 mg by mouth 2 (two) times daily. 01/23/21   [provider]  cetirizine (ZYRTEC) 10 MG tablet Take by mouth.    [provider]  rosuvastatin (CRESTOR) 10 MG tablet Take 1 tablet (10 mg total) by mouth daily. 01/27/18   Pucilowska, Ellin Goodie, MD  sildenafil (REVATIO) 20 MG tablet Take 3-5 tablets as needed one hour prior to intercourse 04/15/21   Vanna Scotland, MD    Allergies Patient has no known allergies.  Family History  Problem Relation Age of Onset   Diabetes Mother    Depression Mother    Anxiety disorder Mother    COPD Mother    Heart attack Father    Diabetes Father     Social History Social History   Tobacco Use   Smoking status: Every Day    Packs/day: 1.00    Types: Cigarettes    Start date: 02/07/2000   Smokeless tobacco: Current    Types: Snuff, Chew  Vaping Use   Vaping Use: Some days  Substance Use Topics   Alcohol use: Yes    Alcohol/week: 14.0 standard drinks    Types: 12 Cans of beer, 2 Shots of liquor per week    Comment: trying to quit last about 2 weeks  ago   Drug use: No    Review of Systems  Constitutional: No fever/chills Eyes: No visual changes. ENT: No sore throat. Cardiovascular: Denies chest pain. Respiratory: Denies shortness of breath. Gastrointestinal: No abdominal pain.  No nausea, no vomiting.  No diarrhea.  No constipation. Genitourinary: Negative for dysuria. Musculoskeletal: Negative for back pain. Skin: Negative for rash. Neurological: Negative for headaches, focal weakness or numbness.  Positive for suicidal ideation.  ____________________________________________   PHYSICAL EXAM:  VITAL SIGNS: ED Triage Vitals [07/17/21 0255]  Enc Vitals Group     BP (!) 123/92     Pulse Rate 99     Resp 20     Temp 98 F (36.7 C)     Temp  Source Oral     SpO2 98 %     Weight      Height      Head Circumference      Peak Flow      Pain Score      Pain Loc      Pain Edu?      Excl. in GC?     Constitutional: Alert and oriented. Eyes: Conjunctivae are normal. Head: Atraumatic. Nose: No congestion/rhinnorhea. Mouth/Throat: Mucous membranes are moist. Neck: Normal ROM Cardiovascular: Normal rate, regular rhythm. Grossly normal heart sounds. Respiratory: Normal respiratory effort.  No retractions. Lungs CTAB. Gastrointestinal: Soft and nontender. No distention. Genitourinary: deferred Musculoskeletal: No lower extremity tenderness nor edema. Neurologic:  Normal speech and language. No gross focal neurologic deficits are appreciated. Skin:  Skin is warm, dry and intact. No rash noted. Psychiatric: Mood and affect are normal. Speech and behavior are normal.  ____________________________________________   LABS (all labs ordered are listed, but only abnormal results are displayed)  Labs Reviewed  COMPREHENSIVE METABOLIC PANEL - Abnormal; Notable for the following components:      Result Value   Glucose, Bld 120 (*)    ALT 47 (*)    All other components within normal limits  ETHANOL - Abnormal; Notable for the following components:   Alcohol, Ethyl (B) 228 (*)    All other components within normal limits  SALICYLATE LEVEL - Abnormal; Notable for the following components:   Salicylate Lvl <7.0 (*)    All other components within normal limits  ACETAMINOPHEN LEVEL - Abnormal; Notable for the following components:   Acetaminophen (Tylenol), Serum <10 (*)    All other components within normal limits  CBC - Abnormal; Notable for the following components:   RBC 6.17 (*)    Hemoglobin 19.3 (*)    HCT 56.4 (*)    All other components within normal limits  RESP PANEL BY RT-PCR (FLU A&B, COVID) ARPGX2  URINE DRUG SCREEN, QUALITATIVE ([facility] ONLY)    PROCEDURES  Procedure(s) performed (including Critical  Care):  Procedures   ____________________________________________   INITIAL IMPRESSION / ASSESSMENT AND PLAN / ED COURSE      40 year old male with past medical history of hyperlipidemia, alcohol abuse, major depressive disorder, and deafness who presents to the ED for psychiatric evaluation after making suicidal threats and being involved in a standoff with police that involved a firearm.  Patient is now calm and cooperative, but not very forthcoming with history.  He denies any medical complaints and screening labs are remarkable only for elevated blood alcohol level.  He may be medically cleared for psychiatric disposition and will likely require psychiatric admission.  The patient has been placed in psychiatric observation due to the need to provide a  safe environment for the patient while obtaining psychiatric consultation and evaluation, as well as ongoing medical and medication management to treat the patient's condition.  The patient has been placed under full IVC at this time.       ____________________________________________   FINAL CLINICAL IMPRESSION(S) / ED DIAGNOSES  Final diagnoses:  Suicidal ideation     ED Discharge Orders     None        Note:  This document was prepared using Dragon voice recognition software and may include unintentional dictation errors.    Chesley Noon, MD 07/17/21 [phone]

## 2021-07-17 NOTE — ED Triage Notes (Addendum)
Pt arrived under IVC with [facility] department with affidavit stating pt had a gun in the home and discharged it. Girlfriend and mother in home and called 911. After 6-7 hour standoff pt removed by force from home. Pt communicated threats to self, family and police. In triage pt sts to writer that he wants to die since losing his niece 2 weeks ago. Pt admits to ETOH use. Pt calmed from initial arrival. Pt admits to SI but denies HI.    1 blue jean 2 blue brief 2 black shoes 1 blue shirt  All placed into belongings bag with label. Pt is deaf and wears cochlear implants, one on and one with battery in bag.

## 2021-07-17 NOTE — BH Assessment (Signed)
Comprehensive Clinical Assessment (CCA) Note  07/17/2021 JEAN-PAUL KOSH OX:[id number]  Chief Complaint: Patient is a 40 year old male presenting to [facility] ED under IVC. Per triage note Pt arrived under IVC with [place] with affidavit stating pt had a gun in the home and discharged it. Girlfriend and mother in home and called 67. After 6-7 hour standoff pt removed by force from home. Pt communicated threats to self, family and police. In triage pt sts to writer that he wants to die since losing his niece 2 weeks ago. Pt admits to ETOH use. Pt calmed from initial arrival. Pt admits to SI but denies HI. During assessment patient appears alert and oriented x4, calm and cooperative. When asked if patient understands why he is presenting to the ED patient reports "I don't know" patient is unable to recall what happened but is able to recall that he drank last night. Patient reports "I try not to drink every day" but is unable to recall the amounts he uses when he does drink. Patient BAL is 228. Patient has a history of mental health treatment in the past for his depression and alcohol intoxication. Patient currently denies SI/HI  Per Psyc NP Ysidro Evert patient is recommended for Inpatient Hospitalization Chief Complaint  Patient presents with   Suicidal   Visit Diagnosis: Major Depressive Disorder, recurrent episode severe. Alcohol Use Disorder severe    CCA Screening, Triage and Referral (STR)  Patient Reported Information How did you hear about [place]? Legal System  Referral name: No data recorded Referral phone number: No data recorded  Whom do you see for routine medical problems? No data recorded Practice/Facility Name: No data recorded Practice/Facility Phone Number: No data recorded Name of Contact: No data recorded Contact Number: No data recorded Contact Fax Number: No data recorded Prescriber Name: No data recorded Prescriber Address (if known): No data  recorded  What Is the Reason for Your Visit/Call Today? Patient presents under IVC after being intoxicated, SI and HI  How Long Has This Been Causing You Problems? > than 6 months  What Do You Feel Would Help You the Most Today? No data recorded  Have You Recently Been in Any Inpatient Treatment (Hospital/Detox/Crisis Center/28-Day Program)? No data recorded Name/Location of Program/Hospital:No data recorded How Long Were You There? No data recorded When Were You Discharged? No data recorded  Have You Ever Received Services From [facility] Before? No data recorded Who Do You See at [facility]? No data recorded  Have You Recently Had Any Thoughts About Hurting Yourself? No  Are You Planning to Commit Suicide/Harm Yourself At This time? No   Have you Recently Had Thoughts About [place]? No  Explanation: No data recorded  Have You Used Any Alcohol or Drugs in the Past 24 Hours? Yes  How Long Ago Did You Use Drugs or Alcohol? No data recorded What Did You Use and How Much? Alcohol, unknown amounts   Do You Currently Have a Therapist/Psychiatrist? No  Name of Therapist/Psychiatrist: No data recorded  Have You Been Recently Discharged From Any Office Practice or Programs? No  Explanation of Discharge From Practice/Program: No data recorded    CCA Screening Triage Referral Assessment Type of Contact: Face-to-Face  Is this Initial or Reassessment? No data recorded Date Telepsych consult ordered in [facility]:  No data recorded Time Telepsych consult ordered in [facility]:  No data recorded  Patient Reported Information Reviewed? No data recorded Patient Left Without Being Seen? No  data recorded Reason for Not Completing Assessment: No data recorded  Collateral Involvement: No data recorded  Does Patient Have a [place]? No data recorded Name and Contact of Legal Guardian: No data recorded If Minor and Not Living with Parent(s), Who has Custody? No  data recorded Is CPS involved or ever been involved? Never  Is APS involved or ever been involved? Never   Patient Determined To Be At Risk for Harm To Self or Others Based on Review of Patient Reported Information or Presenting Complaint? No  Method: No data recorded Availability of Means: No data recorded Intent: No data recorded Notification Required: No data recorded Additional Information for Danger to Others Potential: No data recorded Additional Comments for Danger to Others Potential: No data recorded Are There Guns or Other Weapons in Your Home? No data recorded Types of Guns/Weapons: No data recorded Are These Weapons Safely Secured?                            No data recorded Who Could Verify You Are Able To Have These Secured: No data recorded Do You Have any Outstanding Charges, Pending Court Dates, Parole/Probation? No data recorded Contacted To Inform of Risk of Harm To Self or Others: No data recorded  Location of Assessment: [facility] ED   Does Patient Present under Involuntary Commitment? 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  Preferences: No data recorded Abilities: No data recorded  Type of Services Patient Feels are Needed: No data recorded  Initial Clinical Notes/Concerns: No data recorded  Mental Health Symptoms Depression:   Change in energy/activity; Fatigue; Hopelessness; Irritability   Duration of Depressive symptoms:  Greater than two weeks   Mania:   None   Anxiety:    Difficulty  concentrating; Restlessness   Psychosis:   None   Duration of Psychotic symptoms: No data recorded  Trauma:   None   Obsessions:   None   Compulsions:   None   Inattention:   None   Hyperactivity/Impulsivity:   None   Oppositional/Defiant Behaviors:   None   Emotional Irregularity:   None   Other Mood/Personality Symptoms:  No data recorded   Mental Status Exam Appearance and self-care  Stature:   Average   Weight:   Overweight   Clothing:   Disheveled   Grooming:   Neglected   Cosmetic use:   None   Posture/gait:   Normal   Motor activity:   Not Remarkable   Sensorium  Attention:   Normal   Concentration:   Normal   Orientation:   X5   Recall/memory:   Defective in Recent   Affect and Mood  Affect:   Appropriate   Mood:   Depressed   Relating  Eye contact:   Normal   Facial expression:   Responsive   Attitude toward examiner:   Cooperative   Thought and Language  Speech flow:  Clear and Coherent   Thought content:   Appropriate to Mood and Circumstances   Preoccupation:   None  Hallucinations:   None   Organization:  No data recorded  [employer] of Knowledge:   Fair   Intelligence:   Average   Abstraction:   Normal   Judgement:   Impaired   Reality Testing:   Adequate   Insight:   Lacking; None/zero insight   Decision Making:   Impulsive   Social Functioning  Social Maturity:   Responsible   Social Judgement:   Normal   Stress  Stressors:   Family conflict   Coping Ability:   Contractor Deficits:   Communication   Supports:   Family     Religion: Religion/Spirituality Are You A Religious Person?: No  Leisure/Recreation: Leisure / Recreation Do You Have Hobbies?: No  Exercise/Diet: Exercise/Diet Do You Exercise?: No Have You Gained or Lost A Significant Amount of Weight in the Past Six Months?: No Do You Follow a Special Diet?: No Do You Have  Any Trouble Sleeping?: No   CCA Employment/Education Employment/Work Situation: Employment / Work Situation Employment Situation:  (Unknown) Has Patient ever Been in Equities trader?: No  Education: Education Is Patient Currently Attending School?: No Did You Have An Individualized Education Program (IIEP): No Did You Have Any Difficulty At [employer]?: No Patient's Education Has Been Impacted by Current Illness: No   CCA Family/Childhood History Family and Relationship History: Family history Marital status: Married What types of issues is patient dealing with in the relationship?: Unknown Additional relationship information: Unknown Does patient have children?:  (Unknown)  Childhood History:  Childhood History Did patient suffer any verbal/emotional/physical/sexual abuse as a child?: No Did patient suffer from severe childhood neglect?: No Has patient ever been sexually abused/assaulted/raped as an adolescent or adult?: No Was the patient ever a victim of a crime or a disaster?: No Witnessed domestic violence?: No Has patient been affected by domestic violence as an adult?: No  Child/Adolescent Assessment:     CCA Substance Use Alcohol/Drug Use: Alcohol / Drug Use Pain Medications: See MAR Prescriptions: See MAR Over the Counter: See MAR History of alcohol / drug use?: Yes Substance #1 Name of Substance 1: Alcohol 1 - Age of First Use: Unknown 1 - Amount (size/oz): Patient is able to report the amounts 1 - Frequency: "I try not to drink every day" 1 - Last Use / Amount: 07/17/21 1- Route of Use: Oral                       ASAM's:  Six Dimensions of Multidimensional Assessment  Dimension 1:  Acute Intoxication and/or Withdrawal Potential:      Dimension 2:  Biomedical Conditions and Complications:      Dimension 3:  Emotional, Behavioral, or Cognitive Conditions and Complications:     Dimension 4:  Readiness to Change:     Dimension 5:  Relapse,  Continued use, or Continued Problem Potential:     Dimension 6:  Recovery/Living Environment:     ASAM Severity Score:    ASAM Recommended Level of Treatment:     Substance use Disorder (SUD) Substance Use Disorder (SUD)  Checklist Symptoms of Substance Use: Continued use despite having a persistent/recurrent physical/psychological problem caused/exacerbated by use, Continued use despite persistent or recurrent social, interpersonal problems, caused or exacerbated by use, Recurrent use that results in a failure to fulfill major role obligations (work, school, home), Repeated use in physically hazardous situations, Social, occupational, recreational activities given up or reduced due to use  Recommendations for Services/Supports/Treatments:  DSM5 Diagnoses: Patient Active Problem List   Diagnosis Date Noted   Major depressive disorder, recurrent severe without psychotic features ([place]) 01/26/2018   Cannabis use disorder, moderate, dependence ([place]) 01/26/2018   Tobacco use disorder 01/25/2018   Alcohol use disorder, severe, dependence ([place]) 02/16/2015   Abnormal LFTs 02/07/2015   AA (alcohol abuse) 02/07/2015   Allergic rhinitis 02/07/2015   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   Acid reflux 02/07/2015   Difficulty hearing 02/07/2015   HLD (hyperlipidemia) 02/07/2015   Recurrent major depression-severe ([place]) 02/07/2015   Alcohol abuse 02/07/2015   Panic disorder 02/07/2015    Patient Centered Plan: Patient is on the following Treatment Plan(s):  Depression and Substance Abuse   Referrals to Alternative Service(s): Referred to Alternative Service(s):   Place:   Date:   Time:    Referred to Alternative Service(s):   Place:   Date:   Time:    Referred to Alternative Service(s):   Place:   Date:   Time:    Referred to Alternative Service(s):   Place:   Date:   Time:     Tommy Goostree A Aline Wesche, LCAS-A

## 2021-07-17 NOTE — ED Notes (Signed)
Breakfast tray given. °

## 2021-07-17 NOTE — ED Notes (Signed)
Ambulatory to bathroom. Alert and oriented X4. Appears clinically sober.

## 2021-07-17 NOTE — ED Notes (Signed)
Pt given lunch tray and water cups

## 2021-07-17 NOTE — ED Notes (Signed)
Transported via [facility] to [facility]. Left with 1 bag of belongings.
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
## 2021-07-17 NOTE — BH Assessment (Signed)
Referral information for Psychiatric Hospitalization faxed to;   [facility] [phone]),   [facility] [phone]),  [address] [phone]),   [facility] [phone] -or- [phone]),   [facility] [phone])

## 2021-07-17 NOTE — BH Assessment (Signed)
PATIENT BED AVAILABLE AFTER 9AM ON 07/17/21  Patient has been accepted to [facility].  Patient assigned to [facility] Accepting physician is Dr. Neldon Newport.  Call report to [phone].  Representative was [employer].   ER Staff is aware of it:  [facility] ER Secretary  Dr. Larinda Buttery, ER MD  Katie Patient's Nurse     Address: [address] [place] [address]

## 2021-08-19 ENCOUNTER — Emergency Department
Admission: EM | Admit: 2021-08-19 | Discharge: 2021-08-19 | Disposition: A | Discharge status: Home / Self Care | Payer: Medicaid Other | Attending: Emergency Medicine | Admitting: Emergency Medicine

## 2021-08-19 ENCOUNTER — Emergency Department: Payer: Medicaid Other

## 2021-08-19 ENCOUNTER — Other Ambulatory Visit: Payer: Self-pay

## 2021-08-19 DIAGNOSIS — S2241XA Multiple fractures of ribs, right side, initial encounter for closed fracture: Secondary | ICD-10-CM | POA: Diagnosis not present

## 2021-08-19 DIAGNOSIS — R0781 Pleurodynia: Secondary | ICD-10-CM | POA: Insufficient documentation

## 2021-08-19 DIAGNOSIS — S0181XA Laceration without foreign body of other part of head, initial encounter: Secondary | ICD-10-CM | POA: Diagnosis not present

## 2021-08-19 DIAGNOSIS — S299XXA Unspecified injury of thorax, initial encounter: Secondary | ICD-10-CM | POA: Diagnosis present

## 2021-08-19 DIAGNOSIS — Y92410 Unspecified street and highway as the place of occurrence of the external cause: Secondary | ICD-10-CM | POA: Insufficient documentation

## 2021-08-19 DIAGNOSIS — R9389 Abnormal findings on diagnostic imaging of other specified body structures: Secondary | ICD-10-CM | POA: Diagnosis not present

## 2021-08-19 DIAGNOSIS — R0789 Other chest pain: Secondary | ICD-10-CM | POA: Diagnosis present

## 2021-08-19 DIAGNOSIS — R9431 Abnormal electrocardiogram [ECG] [EKG]: Secondary | ICD-10-CM | POA: Insufficient documentation

## 2021-08-19 DIAGNOSIS — R911 Solitary pulmonary nodule: Secondary | ICD-10-CM | POA: Diagnosis not present

## 2021-08-19 LAB — CBC
HCT: 45.5 % (ref 39.0–52.0)
Hemoglobin: 15.9 g/dL (ref 13.0–17.0)
MCH: 31.2 pg (ref 26.0–34.0)
MCHC: 34.9 g/dL (ref 30.0–36.0)
MCV: 89.2 fL (ref 80.0–100.0)
Platelets: 226 10*3/uL (ref 150–400)
RBC: 5.1 MIL/uL (ref 4.22–5.81)
RDW: 12 % (ref 11.5–15.5)
WBC: 10.4 10*3/uL (ref 4.0–10.5)
nRBC: 0 % (ref 0.0–0.2)

## 2021-08-19 LAB — BASIC METABOLIC PANEL
Anion gap: 9 (ref 5–15)
BUN: 8 mg/dL (ref 6–20)
CO2: 23 mmol/L (ref 22–32)
Calcium: 9.1 mg/dL (ref 8.9–10.3)
Chloride: 94 mmol/L — ABNORMAL LOW (ref 98–111)
Creatinine, Ser: 0.88 mg/dL (ref 0.61–1.24)
GFR, Estimated: >60 mL/min (ref >60)
Glucose, Bld: 116 mg/dL — ABNORMAL HIGH (ref 70–99)
Potassium: 4.4 mmol/L (ref 3.5–5.1)
Sodium: 126 mmol/L — ABNORMAL LOW (ref 135–145)

## 2021-08-19 MED ORDER — IOHEXOL 300 MG/ML  SOLN
100.0000 mL | Freq: Once | INTRAMUSCULAR | Status: AC | PRN
  Administered 2021-08-19: 100 mL via INTRAVENOUS

## 2021-08-19 MED ORDER — HYDROCODONE-ACETAMINOPHEN 5-325 MG PO TABS: 1.0000 | ORAL_TABLET | Freq: Four times a day (QID) | ORAL | 0 refills | Status: DC | PRN

## 2021-08-19 MED ORDER — ONDANSETRON HCL 4 MG/2ML IJ SOLN
4.0000 mg | Freq: Once | INTRAMUSCULAR | Status: AC
  Administered 2021-08-19: 4 mg via INTRAVENOUS
  Filled 2021-08-19: qty 2

## 2021-08-19 MED ORDER — MORPHINE SULFATE (PF) 4 MG/ML IV SOLN
4.0000 mg | Freq: Once | INTRAVENOUS | Status: AC
  Administered 2021-08-19: 4 mg via INTRAVENOUS
  Filled 2021-08-19: qty 1

## 2021-08-19 MED ORDER — ONDANSETRON 4 MG PO TBDP: 4.0000 mg | ORAL_TABLET | Freq: Once | ORAL | Status: DC

## 2021-08-19 MED ORDER — MORPHINE SULFATE (PF) 4 MG/ML IV SOLN
4.0000 mg | Freq: Once | INTRAVENOUS | Status: AC
Start: 2021-08-19 — End: 2021-08-19
  Administered 2021-08-19: 4 mg via INTRAVENOUS
  Filled 2021-08-19: qty 1

## 2021-08-19 MED ORDER — OXYCODONE-ACETAMINOPHEN 5-325 MG PO TABS: 1.0000 | ORAL_TABLET | Freq: Once | ORAL | Status: DC
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
## 2021-08-19 NOTE — Discharge Instructions (Signed)
As we discussed it is important that you use your incentive spirometer to help prevent pneumonias. Please be sure to follow up with Dr. Lucilla Lame clinic. Additionally your information has been sent to our pulmonary nodule clinic for follow up of your pulmonary nodule. Additionally please follow up with your primary care physician. While the imaging today suggested a possible fracture of your lower spine, given no significant tenderness we have low suspicion, but if you stat developing severe low back pain please be seen. Please seek medical attention for any high fevers, chest pain, shortness of breath, change in behavior, persistent vomiting, bloody stool or any other new or concerning symptoms.

## 2021-09-04 ENCOUNTER — Ambulatory Visit (INDEPENDENT_AMBULATORY_CARE_PROVIDER_SITE_OTHER): Payer: Medicaid Other | Admitting: Pulmonary Disease

## 2021-09-04 ENCOUNTER — Other Ambulatory Visit: Payer: Self-pay

## 2021-09-04 ENCOUNTER — Encounter: Payer: Self-pay | Admitting: Pulmonary Disease

## 2021-09-04 VITALS — BP 142/88 | HR 83 | Temp 98.2°F | Ht 72.0 in | Wt 212.4 lb

## 2021-09-04 DIAGNOSIS — F17210 Nicotine dependence, cigarettes, uncomplicated: Secondary | ICD-10-CM

## 2021-09-04 DIAGNOSIS — S2241XA Multiple fractures of ribs, right side, initial encounter for closed fracture: Secondary | ICD-10-CM

## 2021-09-04 DIAGNOSIS — R918 Other nonspecific abnormal finding of lung field: Secondary | ICD-10-CM

## 2021-09-04 DIAGNOSIS — R911 Solitary pulmonary nodule: Secondary | ICD-10-CM

## 2021-09-04 DIAGNOSIS — J449 Chronic obstructive pulmonary disease, unspecified: Secondary | ICD-10-CM | POA: Diagnosis not present

## 2021-09-04 MED ORDER — KETOROLAC TROMETHAMINE 10 MG PO TABS: 10.0000 mg | ORAL_TABLET | Freq: Four times a day (QID) | ORAL | 0 refills | Status: DC | PRN

## 2021-09-04 MED ORDER — ALBUTEROL SULFATE HFA 108 (90 BASE) MCG/ACT IN AERS: 2.0000 | INHALATION_SPRAY | Freq: Four times a day (QID) | RESPIRATORY_TRACT | 1 refills | Status: DC | PRN

## 2021-09-04 MED ORDER — TRELEGY ELLIPTA 100-62.5-25 MCG/ACT IN AEPB: 100.0000 ug | INHALATION_SPRAY | Freq: Every day | RESPIRATORY_TRACT | 0 refills | Status: DC

## 2021-09-04 MED ORDER — LIDOCAINE 5 % EX PTCH: MEDICATED_PATCH | CUTANEOUS | 0 refills | Status: DC

## 2021-09-04 NOTE — Patient Instructions (Signed)
I have written some prescriptions for your pain mainly on lidocaine patch and some Toradol.  DO NOT TAKE MOTRIN WHILE TAKING THE TORADOL.  For your breathing I have given you a trial of Trelegy Ellipta 1 inhalation daily, make sure you rinse your mouth well after you use it.  Let us know how you do with that inhaler.  I also have given you a scription for albuterol which is an emergency inhaler that you can take up to 4 times a day if you have any shortness of breath.  You do not need to take it unless you are short of breath.  We will see him in follow-up in 4 to 6 weeks time.  We are scheduling a follow-up CT scan of the chest in 3 months.  The size of the 2 small spots found in your lung is referenced below, the spots are about the size of a pencil eraser or even smaller:

## 2021-09-04 NOTE — Telephone Encounter (Signed)
2 samples of trelegy 100 given to patient during office visit on 1/19. Nothing further needed.
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
## 2021-09-30 ENCOUNTER — Encounter (INDEPENDENT_AMBULATORY_CARE_PROVIDER_SITE_OTHER): Payer: Self-pay | Admitting: Vascular Surgery

## 2021-10-21 ENCOUNTER — Telehealth: Payer: Self-pay | Admitting: Pharmacy Technician

## 2021-10-21 ENCOUNTER — Encounter: Payer: Self-pay | Admitting: Pulmonary Disease

## 2021-10-21 ENCOUNTER — Ambulatory Visit (INDEPENDENT_AMBULATORY_CARE_PROVIDER_SITE_OTHER): Payer: Medicaid Other | Admitting: Pulmonary Disease

## 2021-10-21 ENCOUNTER — Other Ambulatory Visit: Payer: Self-pay

## 2021-10-21 VITALS — BP 136/84 | HR 100 | Temp 97.1°F | Ht 72.0 in | Wt 212.8 lb

## 2021-10-21 DIAGNOSIS — F17210 Nicotine dependence, cigarettes, uncomplicated: Secondary | ICD-10-CM

## 2021-10-21 DIAGNOSIS — R918 Other nonspecific abnormal finding of lung field: Secondary | ICD-10-CM

## 2021-10-21 DIAGNOSIS — J449 Chronic obstructive pulmonary disease, unspecified: Secondary | ICD-10-CM

## 2021-10-21 MED ORDER — TRELEGY ELLIPTA 100-62.5-25 MCG/ACT IN AEPB: 100.0000 ug | INHALATION_SPRAY | Freq: Every day | RESPIRATORY_TRACT | 0 refills | Status: DC

## 2021-10-21 MED ORDER — TRELEGY ELLIPTA 100-62.5-25 MCG/ACT IN AEPB: 1.0000 | INHALATION_SPRAY | Freq: Every day | RESPIRATORY_TRACT | 11 refills | Status: DC

## 2021-10-21 NOTE — Progress Notes (Signed)
tre

## 2021-10-21 NOTE — Telephone Encounter (Signed)
Received PA request for Trelegy inhaler. Patient's plan requires patient to have tried/failed or have contraindications to 2 preferred agents: Please advise ? ? ?

## 2021-10-21 NOTE — Patient Instructions (Signed)
We are going to give him more samples of Trelegy. ? ?He needs to quit smoking. ? ?We are ordering breathing tests. ? ? ?We will see him in follow-up in 3 months time call sooner should any new problems arise. ?
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
## 2021-10-22 ENCOUNTER — Other Ambulatory Visit (HOSPITAL_COMMUNITY): Payer: Self-pay

## 2021-10-22 ENCOUNTER — Telehealth: Payer: Self-pay

## 2021-10-22 NOTE — Telephone Encounter (Signed)
Patient Advocate Encounter ?  ?Received notification from patient calls that prior authorization for Trelegy Salomon Mast is required by his/her insurance [employer]. ?  ?PA submitted on 10/22/21 ? ?Key#: [id number] ? ?Status is pending ?   ?[facility] will continue to follow: ? ?Patient Advocate ?Fax: [phone]  ?

## 2021-10-23 MED ORDER — BUDESONIDE-FORMOTEROL FUMARATE 160-4.5 MCG/ACT IN AERO: 2.0000 | INHALATION_SPRAY | Freq: Two times a day (BID) | RESPIRATORY_TRACT | 12 refills | Status: DC

## 2021-10-23 MED ORDER — SPIRIVA RESPIMAT 2.5 MCG/ACT IN AERS: 2.0000 | INHALATION_SPRAY | Freq: Every day | RESPIRATORY_TRACT | 11 refills | Status: DC

## 2021-10-23 NOTE — Telephone Encounter (Signed)
Please see 10/23/2021 phone note.  ?

## 2021-10-23 NOTE — Telephone Encounter (Signed)
Patient Advocate Encounter ? ?Received notification from [employer] that the request for prior authorization for Trelegy has been denied due to the patient not trying 2 of the preferred meds.: Symbicort, Dulera, Advair Diskus or Advair HFA.   ?  ?Specialty Pharmacy Patient Advocate ?Fax: [phone]  ?

## 2021-10-23 NOTE — Addendum Note (Signed)
Addended by: Claudette Head A on: 10/23/2021 12:13 PM ? ? Modules accepted: Orders ? ?

## 2021-10-23 NOTE — Telephone Encounter (Signed)
Patient's mother, Jonathan [name](DPR) is aware and voiced her understanding. ?Spiriva and symbicort sent to preferred pharmacy.  ?Nothing further needed.  ?

## 2021-10-23 NOTE — Telephone Encounter (Signed)
He will then need 2 medications to replace the 1 inhaler.  Symbicort is not bioequivalent to Trelegy which is LABA/ICS/LAMA.  Therefore he would need Symbicort 160/4.5, 2 inhalations twice a day and Spiriva Respimat 2 puffs once a day.   ?

## 2021-11-05 NOTE — Pre-Procedure Instructions (Signed)
This Clinical research associate called patient to make him aware that we are no longer requiring Covid test to be done for the PFT and that we would be cancelling his appointment for the COVID test. His mother [facility] informed this Clinical research associate that his insurance had denied coverage and that he could not afford to have the procedure done at this time. MD, Jayme Cloud and Lavona Mound, CMA made aware. ?

## 2021-11-06 ENCOUNTER — Other Ambulatory Visit: Admission: RE | Admit: 2021-11-06 | Payer: No Typology Code available for payment source | Source: Ambulatory Visit

## 2021-11-07 ENCOUNTER — Ambulatory Visit: Payer: No Typology Code available for payment source

## 2021-11-17 ENCOUNTER — Ambulatory Visit: Payer: Medicaid Other

## 2022-01-21 ENCOUNTER — Ambulatory Visit: Payer: Medicaid Other | Admitting: Pulmonary Disease

## 2022-02-03 ENCOUNTER — Encounter (INDEPENDENT_AMBULATORY_CARE_PROVIDER_SITE_OTHER): Payer: Medicaid Other | Admitting: Vascular Surgery

## 2022-05-01 ENCOUNTER — Encounter (INDEPENDENT_AMBULATORY_CARE_PROVIDER_SITE_OTHER): Payer: Medicaid Other | Admitting: Vascular Surgery

## 2022-05-26 ENCOUNTER — Other Ambulatory Visit: Payer: Self-pay | Admitting: Urology

## 2022-05-26 DIAGNOSIS — N5203 Combined arterial insufficiency and corporo-venous occlusive erectile dysfunction: Secondary | ICD-10-CM

## 2022-06-01 ENCOUNTER — Other Ambulatory Visit: Payer: Self-pay | Admitting: Urology

## 2022-06-01 DIAGNOSIS — N5203 Combined arterial insufficiency and corporo-venous occlusive erectile dysfunction: Secondary | ICD-10-CM

## 2022-06-05 ENCOUNTER — Other Ambulatory Visit: Payer: Self-pay

## 2022-06-05 ENCOUNTER — Other Ambulatory Visit: Payer: Self-pay | Admitting: Urology

## 2022-06-05 DIAGNOSIS — N5203 Combined arterial insufficiency and corporo-venous occlusive erectile dysfunction: Secondary | ICD-10-CM

## 2022-06-05 NOTE — Telephone Encounter (Signed)
Spoke with patient and advised he would need office visit for refills, pt state he will go thru his PCP for refills

## 2022-06-05 NOTE — Telephone Encounter (Signed)
Pt called requesting a refill on his Sildenafil, pt last seen on 08/22 . Pt was told follow up when needed and if medication was working. Pt  said that he didn't call back because he didn't have any insurance. Advised pt that I would speck with the provide before this medication , please advise .

## 2022-06-08 ENCOUNTER — Telehealth: Payer: Self-pay

## 2022-06-08 NOTE — Telephone Encounter (Signed)
Left message advising patient he needs an appointment for sildenafil refill.

## 2022-06-21 ENCOUNTER — Emergency Department (HOSPITAL_COMMUNITY): Payer: No Typology Code available for payment source

## 2022-06-21 ENCOUNTER — Other Ambulatory Visit: Payer: Self-pay

## 2022-06-21 ENCOUNTER — Observation Stay (HOSPITAL_COMMUNITY)
Admission: EM | Admit: 2022-06-21 | Discharge: 2022-06-22 | Disposition: A | Discharge status: Home / Self Care | Payer: No Typology Code available for payment source | Attending: Internal Medicine | Admitting: Internal Medicine

## 2022-06-21 ENCOUNTER — Encounter (HOSPITAL_COMMUNITY): Payer: Self-pay

## 2022-06-21 DIAGNOSIS — F17210 Nicotine dependence, cigarettes, uncomplicated: Secondary | ICD-10-CM | POA: Diagnosis not present

## 2022-06-21 DIAGNOSIS — S2239XA Fracture of one rib, unspecified side, initial encounter for closed fracture: Principal | ICD-10-CM | POA: Insufficient documentation

## 2022-06-21 DIAGNOSIS — E782 Mixed hyperlipidemia: Secondary | ICD-10-CM

## 2022-06-21 DIAGNOSIS — S2223XA Sternal manubrial dissociation, initial encounter for closed fracture: Secondary | ICD-10-CM

## 2022-06-21 DIAGNOSIS — Z79899 Other long term (current) drug therapy: Secondary | ICD-10-CM | POA: Diagnosis not present

## 2022-06-21 DIAGNOSIS — S0003XA Contusion of scalp, initial encounter: Secondary | ICD-10-CM

## 2022-06-21 DIAGNOSIS — J939 Pneumothorax, unspecified: Secondary | ICD-10-CM | POA: Diagnosis not present

## 2022-06-21 DIAGNOSIS — J449 Chronic obstructive pulmonary disease, unspecified: Secondary | ICD-10-CM | POA: Diagnosis not present

## 2022-06-21 DIAGNOSIS — S270XXA Traumatic pneumothorax, initial encounter: Secondary | ICD-10-CM

## 2022-06-21 DIAGNOSIS — R7401 Elevation of levels of liver transaminase levels: Secondary | ICD-10-CM | POA: Diagnosis not present

## 2022-06-21 DIAGNOSIS — S2241XA Multiple fractures of ribs, right side, initial encounter for closed fracture: Secondary | ICD-10-CM

## 2022-06-21 DIAGNOSIS — F1010 Alcohol abuse, uncomplicated: Secondary | ICD-10-CM

## 2022-06-21 DIAGNOSIS — F332 Major depressive disorder, recurrent severe without psychotic features: Secondary | ICD-10-CM | POA: Diagnosis present

## 2022-06-21 DIAGNOSIS — F329 Major depressive disorder, single episode, unspecified: Secondary | ICD-10-CM

## 2022-06-21 LAB — COMPREHENSIVE METABOLIC PANEL
ALT: 125 U/L — ABNORMAL HIGH (ref 0–44)
AST: 175 U/L — ABNORMAL HIGH (ref 15–41)
Albumin: 4.3 g/dL (ref 3.5–5.0)
Alkaline Phosphatase: 78 U/L (ref 38–126)
Anion gap: 11 (ref 5–15)
BUN: 12 mg/dL (ref 6–20)
CO2: 26 mmol/L (ref 22–32)
Calcium: 8.7 mg/dL — ABNORMAL LOW (ref 8.9–10.3)
Chloride: 106 mmol/L (ref 98–111)
Creatinine, Ser: 1.1 mg/dL (ref 0.61–1.24)
GFR, Estimated: >60 mL/min (ref >60)
Glucose, Bld: 106 mg/dL — ABNORMAL HIGH (ref 70–99)
Potassium: 4.5 mmol/L (ref 3.5–5.1)
Sodium: 143 mmol/L (ref 135–145)
Total Bilirubin: 0.6 mg/dL (ref 0.3–1.2)
Total Protein: 7.7 g/dL (ref 6.5–8.1)

## 2022-06-21 LAB — TYPE AND SCREEN
ABO/RH(D): O POS
Antibody Screen: NEGATIVE

## 2022-06-21 LAB — CBC
HCT: 51.1 % (ref 39.0–52.0)
Hemoglobin: 16.9 g/dL (ref 13.0–17.0)
MCH: 31.1 pg (ref 26.0–34.0)
MCHC: 33.1 g/dL (ref 30.0–36.0)
MCV: 94.1 fL (ref 80.0–100.0)
Platelets: 267 10*3/uL (ref 150–400)
RBC: 5.43 MIL/uL (ref 4.22–5.81)
RDW: 13.2 % (ref 11.5–15.5)
WBC: 11.4 10*3/uL — ABNORMAL HIGH (ref 4.0–10.5)
nRBC: 0 % (ref 0.0–0.2)

## 2022-06-21 LAB — PROTIME-INR
INR: 1 (ref 0.8–1.2)
Prothrombin Time: 12.6 seconds (ref 11.4–15.2)

## 2022-06-21 LAB — ETHANOL: Alcohol, Ethyl (B): 312 mg/dL (ref <10)

## 2022-06-21 MED ORDER — ACETAMINOPHEN 325 MG PO TABS
650.0000 mg | ORAL_TABLET | Freq: Once | ORAL | Status: AC
  Administered 2022-06-21: 650 mg via ORAL
  Filled 2022-06-21: qty 2

## 2022-06-21 MED ORDER — MORPHINE SULFATE (PF) 2 MG/ML IV SOLN
2.0000 mg | Freq: Once | INTRAVENOUS | Status: AC
  Administered 2022-06-21: 2 mg via INTRAVENOUS
  Filled 2022-06-21: qty 1

## 2022-06-21 MED ORDER — IOHEXOL 300 MG/ML  SOLN
100.0000 mL | Freq: Once | INTRAMUSCULAR | Status: AC | PRN
  Administered 2022-06-21: 100 mL via INTRAVENOUS

## 2022-06-21 MED ORDER — UMECLIDINIUM BROMIDE 62.5 MCG/ACT IN AEPB
1.0000 | INHALATION_SPRAY | Freq: Every day | RESPIRATORY_TRACT | Status: DC
  Administered 2022-06-22: 1 via RESPIRATORY_TRACT
  Filled 2022-06-21: qty 7

## 2022-06-21 MED ORDER — HYDROCODONE-ACETAMINOPHEN 5-325 MG PO TABS
1.0000 | ORAL_TABLET | Freq: Four times a day (QID) | ORAL | Status: DC | PRN
  Administered 2022-06-22: 1 via ORAL
  Filled 2022-06-21: qty 1

## 2022-06-21 MED ORDER — ROSUVASTATIN CALCIUM 10 MG PO TABS
10.0000 mg | ORAL_TABLET | Freq: Every day | ORAL | Status: DC
  Administered 2022-06-22: 10 mg via ORAL
  Filled 2022-06-21: qty 1

## 2022-06-21 MED ORDER — TIOTROPIUM BROMIDE MONOHYDRATE 2.5 MCG/ACT IN AERS: 2.0000 | INHALATION_SPRAY | Freq: Every day | RESPIRATORY_TRACT | Status: DC

## 2022-06-21 MED ORDER — ONDANSETRON HCL 4 MG/2ML IJ SOLN: 4.0000 mg | Freq: Four times a day (QID) | INTRAMUSCULAR | Status: DC | PRN

## 2022-06-21 MED ORDER — MOMETASONE FURO-FORMOTEROL FUM 200-5 MCG/ACT IN AERO
2.0000 | INHALATION_SPRAY | Freq: Two times a day (BID) | RESPIRATORY_TRACT | Status: DC
  Administered 2022-06-22: 2 via RESPIRATORY_TRACT
  Filled 2022-06-21: qty 8.8

## 2022-06-21 MED ORDER — ONDANSETRON HCL 4 MG PO TABS: 4.0000 mg | ORAL_TABLET | Freq: Four times a day (QID) | ORAL | Status: DC | PRN

## 2022-06-21 MED ORDER — PAROXETINE HCL 20 MG PO TABS: 20.0000 mg | ORAL_TABLET | Freq: Every day | ORAL | Status: DC

## 2022-06-21 MED ORDER — SODIUM CHLORIDE 0.9 % IV BOLUS
1000.0000 mL | Freq: Once | INTRAVENOUS | Status: AC
  Administered 2022-06-21: 1000 mL via INTRAVENOUS
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
## 2022-06-21 NOTE — ED Notes (Signed)
Tried to call mom Keaundre Thelin 3X no answer

## 2022-06-22 ENCOUNTER — Observation Stay (HOSPITAL_COMMUNITY): Payer: No Typology Code available for payment source

## 2022-06-22 ENCOUNTER — Encounter (HOSPITAL_COMMUNITY): Payer: Self-pay | Admitting: Internal Medicine

## 2022-06-22 DIAGNOSIS — S2239XA Fracture of one rib, unspecified side, initial encounter for closed fracture: Secondary | ICD-10-CM | POA: Diagnosis present

## 2022-06-22 DIAGNOSIS — R7401 Elevation of levels of liver transaminase levels: Secondary | ICD-10-CM | POA: Insufficient documentation

## 2022-06-22 DIAGNOSIS — S0003XA Contusion of scalp, initial encounter: Secondary | ICD-10-CM | POA: Diagnosis present

## 2022-06-22 DIAGNOSIS — J939 Pneumothorax, unspecified: Secondary | ICD-10-CM | POA: Insufficient documentation

## 2022-06-22 DIAGNOSIS — S2223XA Sternal manubrial dissociation, initial encounter for closed fracture: Secondary | ICD-10-CM | POA: Insufficient documentation

## 2022-06-22 DIAGNOSIS — J449 Chronic obstructive pulmonary disease, unspecified: Secondary | ICD-10-CM | POA: Insufficient documentation

## 2022-06-22 LAB — CBC
HCT: 43.3 % (ref 39.0–52.0)
Hemoglobin: 14.5 g/dL (ref 13.0–17.0)
MCH: 31.4 pg (ref 26.0–34.0)
MCHC: 33.5 g/dL (ref 30.0–36.0)
MCV: 93.7 fL (ref 80.0–100.0)
Platelets: 254 10*3/uL (ref 150–400)
RBC: 4.62 MIL/uL (ref 4.22–5.81)
RDW: 13.3 % (ref 11.5–15.5)
WBC: 10.4 10*3/uL (ref 4.0–10.5)
nRBC: 0 % (ref 0.0–0.2)

## 2022-06-22 LAB — COMPREHENSIVE METABOLIC PANEL
ALT: 95 U/L — ABNORMAL HIGH (ref 0–44)
AST: 128 U/L — ABNORMAL HIGH (ref 15–41)
Albumin: 4.1 g/dL (ref 3.5–5.0)
Alkaline Phosphatase: 74 U/L (ref 38–126)
Anion gap: 15 (ref 5–15)
BUN: 14 mg/dL (ref 6–20)
CO2: 20 mmol/L — ABNORMAL LOW (ref 22–32)
Calcium: 8.2 mg/dL — ABNORMAL LOW (ref 8.9–10.3)
Chloride: 100 mmol/L (ref 98–111)
Creatinine, Ser: 0.85 mg/dL (ref 0.61–1.24)
GFR, Estimated: >60 mL/min (ref >60)
Glucose, Bld: 103 mg/dL — ABNORMAL HIGH (ref 70–99)
Potassium: 4.1 mmol/L (ref 3.5–5.1)
Sodium: 135 mmol/L (ref 135–145)
Total Bilirubin: 0.9 mg/dL (ref 0.3–1.2)
Total Protein: 7.2 g/dL (ref 6.5–8.1)

## 2022-06-22 LAB — MAGNESIUM: Magnesium: 1.7 mg/dL (ref 1.7–2.4)

## 2022-06-22 LAB — HIV ANTIBODY (ROUTINE TESTING W REFLEX): HIV Screen 4th Generation wRfx: NONREACTIVE

## 2022-06-22 LAB — PHOSPHORUS: Phosphorus: 3.7 mg/dL (ref 2.5–4.6)

## 2022-06-22 MED ORDER — PAROXETINE HCL 20 MG PO TABS: 20.0000 mg | ORAL_TABLET | Freq: Every day | ORAL | 0 refills | Status: DC

## 2022-06-22 MED ORDER — HYDROCODONE-ACETAMINOPHEN 5-325 MG PO TABS: 1.0000 | ORAL_TABLET | ORAL | 0 refills | Status: DC | PRN

## 2022-06-22 MED ORDER — PAROXETINE HCL 20 MG PO TABS
20.0000 mg | ORAL_TABLET | Freq: Every day | ORAL | Status: DC
  Administered 2022-06-22: 20 mg via ORAL
  Filled 2022-06-22: qty 1

## 2022-06-22 MED ORDER — HYDROCODONE-ACETAMINOPHEN 5-325 MG PO TABS: 1.0000 | ORAL_TABLET | ORAL | Status: DC | PRN

## 2022-06-22 MED ORDER — HYDROMORPHONE HCL 1 MG/ML IJ SOLN
0.5000 mg | INTRAMUSCULAR | Status: DC | PRN
  Administered 2022-06-22 (×3): 0.5 mg via INTRAVENOUS
  Filled 2022-06-22 (×3): qty 0.5

## 2022-06-22 MED ORDER — GUAIFENESIN-DM 100-10 MG/5ML PO SYRP: 5.0000 mL | ORAL_SOLUTION | ORAL | 0 refills | Status: DC | PRN

## 2022-06-22 MED ORDER — LIDOCAINE 5 % EX PTCH: 2.0000 | MEDICATED_PATCH | CUTANEOUS | 0 refills | Status: DC

## 2022-06-22 MED ORDER — LIDOCAINE 5 % EX PTCH
2.0000 | MEDICATED_PATCH | CUTANEOUS | Status: DC
  Administered 2022-06-22: 2 via TRANSDERMAL
  Filled 2022-06-22: qty 2

## 2022-06-22 MED ORDER — BUDESONIDE-FORMOTEROL FUMARATE 160-4.5 MCG/ACT IN AERO: 2.0000 | INHALATION_SPRAY | Freq: Two times a day (BID) | RESPIRATORY_TRACT | 12 refills | Status: AC
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
## 2022-09-15 ENCOUNTER — Inpatient Hospital Stay: Payer: Medicaid Other

## 2022-09-15 ENCOUNTER — Emergency Department: Payer: Medicaid Other

## 2022-09-15 ENCOUNTER — Encounter: Payer: Self-pay | Admitting: Emergency Medicine

## 2022-09-15 ENCOUNTER — Inpatient Hospital Stay
Admission: EM | Admit: 2022-09-15 | Discharge: 2022-09-18 | DRG: 494 | Disposition: A | Discharge status: Home Health | Payer: Medicaid Other | Attending: Obstetrics and Gynecology | Admitting: Obstetrics and Gynecology

## 2022-09-15 ENCOUNTER — Other Ambulatory Visit: Payer: Self-pay

## 2022-09-15 DIAGNOSIS — F411 Generalized anxiety disorder: Secondary | ICD-10-CM | POA: Diagnosis present

## 2022-09-15 DIAGNOSIS — Z79899 Other long term (current) drug therapy: Secondary | ICD-10-CM | POA: Diagnosis not present

## 2022-09-15 DIAGNOSIS — F32A Depression, unspecified: Secondary | ICD-10-CM | POA: Diagnosis present

## 2022-09-15 DIAGNOSIS — J449 Chronic obstructive pulmonary disease, unspecified: Secondary | ICD-10-CM | POA: Diagnosis present

## 2022-09-15 DIAGNOSIS — Z825 Family history of asthma and other chronic lower respiratory diseases: Secondary | ICD-10-CM

## 2022-09-15 DIAGNOSIS — Y92009 Unspecified place in unspecified non-institutional (private) residence as the place of occurrence of the external cause: Secondary | ICD-10-CM

## 2022-09-15 DIAGNOSIS — W19XXXA Unspecified fall, initial encounter: Secondary | ICD-10-CM

## 2022-09-15 DIAGNOSIS — S82309A Unspecified fracture of lower end of unspecified tibia, initial encounter for closed fracture: Secondary | ICD-10-CM | POA: Diagnosis present

## 2022-09-15 DIAGNOSIS — Z7951 Long term (current) use of inhaled steroids: Secondary | ICD-10-CM

## 2022-09-15 DIAGNOSIS — F418 Other specified anxiety disorders: Secondary | ICD-10-CM | POA: Diagnosis present

## 2022-09-15 DIAGNOSIS — W11XXXA Fall on and from ladder, initial encounter: Secondary | ICD-10-CM

## 2022-09-15 DIAGNOSIS — J42 Unspecified chronic bronchitis: Secondary | ICD-10-CM

## 2022-09-15 DIAGNOSIS — S82839A Other fracture of upper and lower end of unspecified fibula, initial encounter for closed fracture: Secondary | ICD-10-CM | POA: Diagnosis present

## 2022-09-15 DIAGNOSIS — F1010 Alcohol abuse, uncomplicated: Secondary | ICD-10-CM | POA: Diagnosis present

## 2022-09-15 DIAGNOSIS — I10 Essential (primary) hypertension: Secondary | ICD-10-CM | POA: Diagnosis present

## 2022-09-15 DIAGNOSIS — S82451A Displaced comminuted fracture of shaft of right fibula, initial encounter for closed fracture: Secondary | ICD-10-CM | POA: Diagnosis present

## 2022-09-15 DIAGNOSIS — F17200 Nicotine dependence, unspecified, uncomplicated: Secondary | ICD-10-CM | POA: Diagnosis not present

## 2022-09-15 DIAGNOSIS — K224 Dyskinesia of esophagus: Secondary | ICD-10-CM | POA: Diagnosis present

## 2022-09-15 DIAGNOSIS — F410 Panic disorder [episodic paroxysmal anxiety] without agoraphobia: Secondary | ICD-10-CM | POA: Diagnosis present

## 2022-09-15 DIAGNOSIS — R7989 Other specified abnormal findings of blood chemistry: Secondary | ICD-10-CM | POA: Diagnosis present

## 2022-09-15 DIAGNOSIS — F17210 Nicotine dependence, cigarettes, uncomplicated: Secondary | ICD-10-CM | POA: Diagnosis present

## 2022-09-15 DIAGNOSIS — S82831A Other fracture of upper and lower end of right fibula, initial encounter for closed fracture: Secondary | ICD-10-CM

## 2022-09-15 DIAGNOSIS — S82872A Displaced pilon fracture of left tibia, initial encounter for closed fracture: Secondary | ICD-10-CM | POA: Diagnosis not present

## 2022-09-15 DIAGNOSIS — Z818 Family history of other mental and behavioral disorders: Secondary | ICD-10-CM

## 2022-09-15 DIAGNOSIS — E785 Hyperlipidemia, unspecified: Secondary | ICD-10-CM | POA: Diagnosis present

## 2022-09-15 DIAGNOSIS — K219 Gastro-esophageal reflux disease without esophagitis: Secondary | ICD-10-CM | POA: Diagnosis present

## 2022-09-15 DIAGNOSIS — S82832A Other fracture of upper and lower end of left fibula, initial encounter for closed fracture: Secondary | ICD-10-CM | POA: Diagnosis not present

## 2022-09-15 DIAGNOSIS — Z22322 Carrier or suspected carrier of Methicillin resistant Staphylococcus aureus: Secondary | ICD-10-CM | POA: Diagnosis not present

## 2022-09-15 DIAGNOSIS — Z9621 Cochlear implant status: Secondary | ICD-10-CM | POA: Diagnosis present

## 2022-09-15 DIAGNOSIS — H905 Unspecified sensorineural hearing loss: Secondary | ICD-10-CM | POA: Diagnosis present

## 2022-09-15 DIAGNOSIS — S82301A Unspecified fracture of lower end of right tibia, initial encounter for closed fracture: Principal | ICD-10-CM

## 2022-09-15 DIAGNOSIS — S82871A Displaced pilon fracture of right tibia, initial encounter for closed fracture: Secondary | ICD-10-CM

## 2022-09-15 LAB — TYPE AND SCREEN
ABO/RH(D): O POS
Antibody Screen: NEGATIVE

## 2022-09-15 LAB — CBC WITH DIFFERENTIAL/PLATELET
Abs Immature Granulocytes: 0.04 10*3/uL (ref 0.00–0.07)
Basophils Absolute: 0 10*3/uL (ref 0.0–0.1)
Basophils Relative: 0 %
Eosinophils Absolute: 0 10*3/uL (ref 0.0–0.5)
Eosinophils Relative: 0 %
HCT: 50.7 % (ref 39.0–52.0)
Hemoglobin: 17.1 g/dL — ABNORMAL HIGH (ref 13.0–17.0)
Immature Granulocytes: 0 %
Lymphocytes Relative: 10 %
Lymphs Abs: 1.2 10*3/uL (ref 0.7–4.0)
MCH: 31.2 pg (ref 26.0–34.0)
MCHC: 33.7 g/dL (ref 30.0–36.0)
MCV: 92.5 fL (ref 80.0–100.0)
Monocytes Absolute: 0.9 10*3/uL (ref 0.1–1.0)
Monocytes Relative: 7 %
Neutro Abs: 9.6 10*3/uL — ABNORMAL HIGH (ref 1.7–7.7)
Neutrophils Relative %: 83 %
Platelets: 294 10*3/uL (ref 150–400)
RBC: 5.48 MIL/uL (ref 4.22–5.81)
RDW: 11.9 % (ref 11.5–15.5)
WBC: 11.8 10*3/uL — ABNORMAL HIGH (ref 4.0–10.5)
nRBC: 0 % (ref 0.0–0.2)

## 2022-09-15 LAB — URINALYSIS, ROUTINE W REFLEX MICROSCOPIC
Bilirubin Urine: NEGATIVE
Glucose, UA: NEGATIVE mg/dL
Hgb urine dipstick: NEGATIVE
Ketones, ur: NEGATIVE mg/dL
Nitrite: NEGATIVE
Protein, ur: NEGATIVE mg/dL
Specific Gravity, Urine: 1.015 (ref 1.005–1.030)
Squamous Epithelial / HPF: NONE SEEN /HPF (ref 0–5)
pH: 5 (ref 5.0–8.0)

## 2022-09-15 LAB — URINE DRUG SCREEN, QUALITATIVE (ARMC ONLY)
Amphetamines, Ur Screen: NOT DETECTED
Barbiturates, Ur Screen: NOT DETECTED
Benzodiazepine, Ur Scrn: POSITIVE — AB
Cannabinoid 50 Ng, Ur ~~LOC~~: NOT DETECTED
Cocaine Metabolite,Ur ~~LOC~~: NOT DETECTED
MDMA (Ecstasy)Ur Screen: NOT DETECTED
Methadone Scn, Ur: NOT DETECTED
Opiate, Ur Screen: POSITIVE — AB
Phencyclidine (PCP) Ur S: NOT DETECTED
Tricyclic, Ur Screen: NOT DETECTED

## 2022-09-15 LAB — HEPATIC FUNCTION PANEL
ALT: 79 U/L — ABNORMAL HIGH (ref 0–44)
AST: 65 U/L — ABNORMAL HIGH (ref 15–41)
Albumin: 4.3 g/dL (ref 3.5–5.0)
Alkaline Phosphatase: 91 U/L (ref 38–126)
Bilirubin, Direct: <0.1 mg/dL (ref 0.0–0.2)
Total Bilirubin: 0.8 mg/dL (ref 0.3–1.2)
Total Protein: 7.9 g/dL (ref 6.5–8.1)

## 2022-09-15 LAB — BASIC METABOLIC PANEL
Anion gap: 14 (ref 5–15)
BUN: 11 mg/dL (ref 6–20)
CO2: 21 mmol/L — ABNORMAL LOW (ref 22–32)
Calcium: 8.7 mg/dL — ABNORMAL LOW (ref 8.9–10.3)
Chloride: 99 mmol/L (ref 98–111)
Creatinine, Ser: 0.98 mg/dL (ref 0.61–1.24)
GFR, Estimated: >60 mL/min (ref >60)
Glucose, Bld: 121 mg/dL — ABNORMAL HIGH (ref 70–99)
Potassium: 4.2 mmol/L (ref 3.5–5.1)
Sodium: 134 mmol/L — ABNORMAL LOW (ref 135–145)

## 2022-09-15 LAB — APTT: aPTT: 25 seconds (ref 24–36)

## 2022-09-15 LAB — PROTIME-INR
INR: 1 (ref 0.8–1.2)
Prothrombin Time: 13.4 seconds (ref 11.4–15.2)

## 2022-09-15 LAB — ETHANOL: Alcohol, Ethyl (B): 30 mg/dL — ABNORMAL HIGH (ref <10)

## 2022-09-15 MED ORDER — ONDANSETRON HCL 4 MG/2ML IJ SOLN: 4.0000 mg | Freq: Three times a day (TID) | INTRAMUSCULAR | Status: DC | PRN

## 2022-09-15 MED ORDER — PAROXETINE HCL 20 MG PO TABS: 20.0000 mg | ORAL_TABLET | Freq: Every day | ORAL | Status: DC

## 2022-09-15 MED ORDER — THIAMINE MONONITRATE 100 MG PO TABS
100.0000 mg | ORAL_TABLET | Freq: Every day | ORAL | Status: DC
  Administered 2022-09-15 – 2022-09-17 (×3): 100 mg via ORAL
  Filled 2022-09-15 (×4): qty 1

## 2022-09-15 MED ORDER — HYDROMORPHONE HCL 1 MG/ML IJ SOLN
1.0000 mg | Freq: Once | INTRAMUSCULAR | Status: AC
  Administered 2022-09-15: 1 mg via INTRAVENOUS
  Filled 2022-09-15: qty 1

## 2022-09-15 MED ORDER — NICOTINE 21 MG/24HR TD PT24
21.0000 mg | MEDICATED_PATCH | Freq: Every day | TRANSDERMAL | Status: DC
  Administered 2022-09-15 – 2022-09-18 (×4): 21 mg via TRANSDERMAL
  Filled 2022-09-15 (×4): qty 1

## 2022-09-15 MED ORDER — MUPIROCIN 2 % EX OINT
1.0000 | TOPICAL_OINTMENT | Freq: Two times a day (BID) | CUTANEOUS | Status: DC
  Administered 2022-09-16 – 2022-09-18 (×6): 1 via NASAL
  Filled 2022-09-15: qty 22

## 2022-09-15 MED ORDER — ALBUTEROL SULFATE (2.5 MG/3ML) 0.083% IN NEBU: 3.0000 mL | INHALATION_SOLUTION | RESPIRATORY_TRACT | Status: DC | PRN

## 2022-09-15 MED ORDER — SODIUM CHLORIDE 0.9 % IV BOLUS
1000.0000 mL | Freq: Once | INTRAVENOUS | Status: AC
  Administered 2022-09-15: 1000 mL via INTRAVENOUS

## 2022-09-15 MED ORDER — LORAZEPAM 1 MG PO TABS
1.0000 mg | ORAL_TABLET | ORAL | Status: DC | PRN
  Administered 2022-09-17: 4 mg via ORAL
  Filled 2022-09-15: qty 4

## 2022-09-15 MED ORDER — ONDANSETRON HCL 4 MG/2ML IJ SOLN
4.0000 mg | Freq: Once | INTRAMUSCULAR | Status: AC
  Administered 2022-09-15: 4 mg via INTRAVENOUS
  Filled 2022-09-15: qty 2

## 2022-09-15 MED ORDER — MORPHINE SULFATE (PF) 4 MG/ML IV SOLN
4.0000 mg | Freq: Once | INTRAVENOUS | Status: AC
  Administered 2022-09-15: 4 mg via INTRAVENOUS
  Filled 2022-09-15: qty 1

## 2022-09-15 MED ORDER — HYDROMORPHONE HCL 1 MG/ML IJ SOLN
1.0000 mg | INTRAMUSCULAR | Status: DC | PRN
  Administered 2022-09-15 – 2022-09-18 (×16): 1 mg via INTRAVENOUS
  Filled 2022-09-15 (×16): qty 1

## 2022-09-15 MED ORDER — METHOCARBAMOL 500 MG PO TABS
500.0000 mg | ORAL_TABLET | Freq: Three times a day (TID) | ORAL | Status: DC | PRN
  Administered 2022-09-16 (×2): 500 mg via ORAL
  Filled 2022-09-15 (×2): qty 1

## 2022-09-15 MED ORDER — IBUPROFEN 400 MG PO TABS: 200.0000 mg | ORAL_TABLET | Freq: Four times a day (QID) | ORAL | Status: DC | PRN

## 2022-09-15 MED ORDER — THIAMINE HCL 100 MG/ML IJ SOLN
100.0000 mg | Freq: Every day | INTRAMUSCULAR | Status: DC
  Administered 2022-09-18: 100 mg via INTRAVENOUS
  Filled 2022-09-15 (×2): qty 2

## 2022-09-15 MED ORDER — MOMETASONE FURO-FORMOTEROL FUM 200-5 MCG/ACT IN AERO
2.0000 | INHALATION_SPRAY | Freq: Two times a day (BID) | RESPIRATORY_TRACT | Status: DC
  Administered 2022-09-15 – 2022-09-18 (×5): 2 via RESPIRATORY_TRACT
  Filled 2022-09-15: qty 8.8

## 2022-09-15 MED ORDER — LORAZEPAM 0.5 MG PO TABS: 0.5000 mg | ORAL_TABLET | ORAL | Status: DC | PRN

## 2022-09-15 MED ORDER — ROSUVASTATIN CALCIUM 10 MG PO TABS
10.0000 mg | ORAL_TABLET | Freq: Every day | ORAL | Status: DC
  Administered 2022-09-15 – 2022-09-18 (×4): 10 mg via ORAL
  Filled 2022-09-15 (×4): qty 1

## 2022-09-15 MED ORDER — DIAZEPAM 5 MG PO TABS
10.0000 mg | ORAL_TABLET | Freq: Four times a day (QID) | ORAL | Status: DC | PRN
  Administered 2022-09-15 – 2022-09-17 (×4): 10 mg via ORAL
  Filled 2022-09-15 (×4): qty 2

## 2022-09-15 MED ORDER — SENNOSIDES-DOCUSATE SODIUM 8.6-50 MG PO TABS: 1.0000 | ORAL_TABLET | Freq: Every evening | ORAL | Status: DC | PRN

## 2022-09-15 MED ORDER — FOLIC ACID 1 MG PO TABS
1.0000 mg | ORAL_TABLET | Freq: Every day | ORAL | Status: DC
  Administered 2022-09-15 – 2022-09-18 (×4): 1 mg via ORAL
  Filled 2022-09-15 (×4): qty 1

## 2022-09-15 MED ORDER — ADULT MULTIVITAMIN W/MINERALS CH
1.0000 | ORAL_TABLET | Freq: Every day | ORAL | Status: DC
  Administered 2022-09-15 – 2022-09-18 (×4): 1 via ORAL
  Filled 2022-09-15 (×3): qty 1

## 2022-09-15 MED ORDER — OXYCODONE-ACETAMINOPHEN 5-325 MG PO TABS
1.0000 | ORAL_TABLET | ORAL | Status: DC | PRN
  Administered 2022-09-15 – 2022-09-17 (×4): 1 via ORAL
  Filled 2022-09-15 (×3): qty 1

## 2022-09-15 MED ORDER — DM-GUAIFENESIN ER 30-600 MG PO TB12: 1.0000 | ORAL_TABLET | Freq: Two times a day (BID) | ORAL | Status: DC | PRN

## 2022-09-15 MED ORDER — LIDOCAINE 5 % EX PTCH
1.0000 | MEDICATED_PATCH | CUTANEOUS | Status: DC
  Administered 2022-09-15 – 2022-09-17 (×2): 1 via TRANSDERMAL
  Filled 2022-09-15 (×2): qty 1
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
## 2022-09-15 NOTE — Consult Note (Signed)
Plan operative fixation tomorrow afternoon if cleared by primary medical team. NPO after midnight.

## 2022-09-15 NOTE — H&P (Signed)
History and Physical    Jonathan [name] QVZ:[id number] DOB: 1980/10/16 DOA: 09/15/2022  Referring MD/NP/PA:   PCP: Ellene Route   Patient coming from:  The patient is coming from home.    Chief Complaint: fall and right leg pain  HPI: Jonathan [name] is a 42 y.o. male with medical history significant of hear of hearing, alcohol abuse, tobacco abuse, HLD, COPD, depression with anxiety, panic disorder, pneumothorax, rib fracture, Barsony-Polgar syndrome, who presents with fall and right leg pain.  Pt states that he accidentally fell yesterday from an 8 foot ladder, injuring his right leg, developed pain in right leg and ankle.  The pain is constant, severe, sharp, nonradiating.  He has not been able to ambulate since injury. He states he crawled inside the house.  He denies head or neck injury.  No loss of consciousness.  Patient does not have chest pain, cough, shortness breath.  No nausea vomiting, diarrhea or abdominal pain.  No symptoms of UTI.  Patient has chronic severe hard of hearing.  He has hearing aids. Patient admits to alcohol use but denies any recreational drugs.   Data reviewed independently and ED Course: pt was found to have WBC 11.8, electrolytes renal function okay, positive UDS for opiates and benzo, urinalysis not impressive, temperature normal, blood pressure 160/87, heart rate 104, 90, RR 20, oxygen saturation 99% on room air.  X-ray showed fracture of proximal right fibula and distal tibia.  X-ray of pelvis, right foot and right femur negative for acute injury.  Patient is admitted to [place] bed as inpatient.  Consulted Dr. Harlow Mares of Ortho.  CT-pelvis: 1. No acute or traumatic finding. 2. 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  X-ray of L spin:  1. No acute or traumatic finding. 2. Chronic bilateral pars defects at L5 with 7 mm of anterolisthesis. Disc degeneration and bulging. No canal stenosis. Bilateral foraminal narrowing with  some potential to affect the exiting L5 nerves.   EKG:  Not done in ED, will get one.     Review of Systems:   General: no fevers, chills, no body weight gain, has fatigue HEENT: no blurry vision, has HOH, no sore throat Respiratory: no dyspnea, coughing, wheezing CV: no chest pain, no palpitations GI: no nausea, vomiting, abdominal pain, diarrhea, constipation GU: no dysuria, burning on urination, increased urinary frequency, hematuria  Ext: no leg edema Neuro: no unilateral weakness, numbness, or tingling, no vision change or hearing loss. Has fall Skin: no rash, no skin tear. MSK: has pain in right leg and right ankle Heme: No easy bruising.  Travel history: No recent long distant travel.   Allergy: No Known Allergies  Past Medical History:  Diagnosis Date   Alcohol abuse    Anxiety    Deaf    Depression    Gastric reflux    Liver disease    Panic disorder     Past Surgical History:  Procedure Laterality Date   COCHLEAR IMPLANT Right    COLON SURGERY      Social History:  reports that he has been smoking cigarettes. He started smoking about 22 years ago. He has a 50.00 pack-year smoking history. His smokeless tobacco use includes snuff and chew. He reports current alcohol use of about 14.0 standard drinks of alcohol per week. He reports that he does not use drugs.  Family History:  Family History  Problem Relation Age of Onset   Diabetes Mother    Depression  Mother    Anxiety disorder Mother    COPD Mother    Heart attack Father    Diabetes Father      Prior to Admission medications   Medication Sig Start Date End Date Taking? Authorizing Provider  budesonide-formoterol (SYMBICORT) 160-4.5 MCG/ACT inhaler Inhale 2 puffs into the lungs 2 (two) times daily. 06/22/22   Manuella Ghazi, Pratik D, DO  diazepam (VALIUM) 10 MG tablet Take 10 mg by mouth every 6 (six) hours as needed for anxiety. Patient not taking: Reported on 06/22/2022    [provider]   guaiFENesin-dextromethorphan (ROBITUSSIN DM) 100-10 MG/5ML syrup Take 5 mLs by mouth every 4 (four) hours as needed for cough. 06/22/22   Manuella Ghazi, Pratik D, DO  HYDROcodone-acetaminophen (NORCO/VICODIN) 5-325 MG tablet Take 1-2 tablets by mouth every 4 (four) hours as needed for moderate pain or severe pain. 06/22/22   Manuella Ghazi, Pratik D, DO  lidocaine (LIDODERM) 5 % Place 2 patches onto the skin daily. Remove & Discard patch within 12 hours or as directed by MD 06/23/22   Manuella Ghazi, Pratik D, DO  PARoxetine (PAXIL) 20 MG tablet Take 1 tablet (20 mg total) by mouth daily. 06/22/22 07/22/22  Manuella Ghazi, Pratik D, DO  rosuvastatin (CRESTOR) 10 MG tablet Take 1 tablet (10 mg total) by mouth daily. 01/27/18   Clovis Fredrickson, MD    Physical Exam: Vitals:   09/15/22 0853 09/15/22 1155 09/15/22 1339 09/15/22 1407  BP:  (!) 130/90 132/88 (!) 160/87  Pulse:  90 88 90  Resp:  18 18 16   Temp:   98 F (36.7 C)   TempSrc:   Oral   SpO2:  99% 99% 94%  Weight: 99.8 kg     Height: 6' (1.829 m)      General: Not in acute distress HEENT:       Eyes: PERRL, EOMI, no scleral icterus.       ENT: No discharge from the ears and nose, no pharynx injection, no tonsillar enlargement.        Neck: No JVD, no bruit, no mass felt. Heme: No neck lymph node enlargement. Cardiac: S1/S2, RRR, No murmurs, No gallops or rubs. Respiratory: No rales, wheezing, rhonchi or rubs. GI: Soft, nondistended, nontender, no rebound pain, no organomegaly, BS present. GU: No hematuria Ext: No pitting leg edema bilaterally. 1+DP/PT pulse bilaterally. Musculoskeletal: has tenderness in right lower leg and ankle Skin: No rashes.  Neuro: Alert, oriented X3, cranial nerves II-XII grossly intact, moves all extremities. Psych: Patient is not psychotic, no suicidal or hemocidal ideation.  Labs on Admission: I have personally reviewed following labs and imaging studies  CBC: Recent Labs  Lab 09/15/22 0821  WBC 11.8*  NEUTROABS 9.6*  HGB 17.1*   HCT 50.7  MCV 92.5  PLT 295   Basic Metabolic Panel: Recent Labs  Lab 09/15/22 0821  NA 134*  K 4.2  CL 99  CO2 21*  GLUCOSE 121*  BUN 11  CREATININE 0.98  CALCIUM 8.7*   GFR: Estimated Creatinine Clearance: 118.9 mL/min (by C-G formula based on SCr of 0.98 mg/dL). Liver Function Tests: Recent Labs  Lab 09/15/22 0821  AST 65*  ALT 79*  ALKPHOS 91  BILITOT 0.8  PROT 7.9  ALBUMIN 4.3   No results for input(s): "LIPASE", "AMYLASE" in the last 168 hours. No results for input(s): "AMMONIA" in the last 168 hours. Coagulation Profile: Recent Labs  Lab 09/15/22 1641  INR 1.0   Cardiac Enzymes: No results for input(s): "CKTOTAL", "CKMB", "  CKMBINDEX", "TROPONINI" in the last 168 hours. BNP (last 3 results) No results for input(s): "PROBNP" in the last 8760 hours. HbA1C: No results for input(s): "HGBA1C" in the last 72 hours. CBG: No results for input(s): "GLUCAP" in the last 168 hours. Lipid Profile: No results for input(s): "CHOL", "HDL", "LDLCALC", "TRIG", "CHOLHDL", "LDLDIRECT" in the last 72 hours. Thyroid Function Tests: No results for input(s): "TSH", "T4TOTAL", "FREET4", "T3FREE", "THYROIDAB" in the last 72 hours. Anemia Panel: No results for input(s): "VITAMINB12", "FOLATE", "FERRITIN", "TIBC", "IRON", "RETICCTPCT" in the last 72 hours. Urine analysis:    Component Value Date/Time   COLORURINE YELLOW (A) 09/15/2022 0848   APPEARANCEUR HAZY (A) 09/15/2022 0848   APPEARANCEUR Hazy (A) 04/15/2021 1544   LABSPEC 1.015 09/15/2022 0848   LABSPEC 1.002 07/02/2012 1558   PHURINE 5.0 09/15/2022 0848   GLUCOSEU NEGATIVE 09/15/2022 0848   GLUCOSEU Negative 07/02/2012 1558   HGBUR NEGATIVE 09/15/2022 0848   BILIRUBINUR NEGATIVE 09/15/2022 0848   BILIRUBINUR Negative 04/15/2021 1544   BILIRUBINUR Negative 07/02/2012 1558   KETONESUR NEGATIVE 09/15/2022 0848   PROTEINUR NEGATIVE 09/15/2022 0848   UROBILINOGEN 1.0 10/16/2008 1501   NITRITE NEGATIVE 09/15/2022  0848   LEUKOCYTESUR SMALL (A) 09/15/2022 0848   LEUKOCYTESUR Negative 07/02/2012 1558   Sepsis Labs: @LABRCNTIP (procalcitonin:4,lacticidven:4) )No results found for this or any previous visit (from the past 240 hour(s)).   Radiological Exams on Admission: DG Femur Min 2 Views Right  Result Date: 09/15/2022 CLINICAL DATA:  Right leg pain after fall from ladder. EXAM: RIGHT FEMUR 2 VIEWS COMPARISON:  None Available. FINDINGS: There is no evidence of fracture or other focal bone lesions. Soft tissues are unremarkable. IMPRESSION: Negative. Electronically Signed   By: 09/17/2022 M.D.   On: 09/15/2022 13:10   CT PELVIS WO CONTRAST  Result Date: 09/15/2022 CLINICAL DATA:  09/17/2022 8 feet from a ladder.  Right hip pain EXAM: CT PELVIS WITHOUT CONTRAST TECHNIQUE: Multidetector CT imaging of the pelvis was performed following the standard protocol without intravenous contrast. RADIATION DOSE REDUCTION: This exam was performed according to the departmental dose-optimization program which includes automated exposure control, adjustment of the mA and/or kV according to patient size and/or use of iterative reconstruction technique. COMPARISON:  Pelvis radiography same day. FINDINGS: Urinary Tract:  No evidence of urinary tract injury. Bowel:  No evidence of bowel injury. Vascular/Lymphatic: No significant vascular finding.  No adenopathy. Reproductive:  Normal Other:  No free fluid or air. Musculoskeletal: No evidence of sacral fracture. 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 No evidence of acute pelvic or hip fracture. IMPRESSION: 1. No acute or traumatic finding. 2. 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 Electronically Signed   By: Larey Seat M.D.   On: 09/15/2022 13:00   CT Lumbar Spine Wo Contrast  Result Date: 09/15/2022 CLINICAL DATA:  09/17/2022 8 feet from a ladder. EXAM: CT LUMBAR SPINE WITHOUT CONTRAST TECHNIQUE: Multidetector CT imaging  of the lumbar spine was performed without intravenous contrast administration. Multiplanar CT image reconstructions were also generated. RADIATION DOSE REDUCTION: This exam was performed according to the departmental dose-optimization program which includes automated exposure control, adjustment of the mA and/or kV according to patient size and/or use of iterative reconstruction technique. COMPARISON:  Lumbar radiography 07/02/2012.  CT 03/16/2021. FINDINGS: Segmentation: 5 lumbar type vertebral bodies. Alignment: Chronic spondylolisthesis at L5-S1 due to bilateral pars defects, measuring 7 mm. Vertebrae: No acute fracture. Chronic bilateral pars defects at L5. Chronic  inferior endplate Schmorl's node at L4. Paraspinal and other soft tissues: Negative Disc levels: No significant finding at L3-4 or above. L4-5: Mild bulging of the disc.  No stenosis. L5-S1: Chronic bilateral pars defects with unchanged anterolisthesis at 7 mm. Disc degeneration and bulging. No canal stenosis. Bilateral foraminal narrowing with some potential to affect the exiting L5 nerves. IMPRESSION: 1. No acute or traumatic finding. 2. Chronic bilateral pars defects at L5 with 7 mm of anterolisthesis. Disc degeneration and bulging. No canal stenosis. Bilateral foraminal narrowing with some potential to affect the exiting L5 nerves. Electronically Signed   By: Paulina Fusi M.D.   On: 09/15/2022 12:57   DG Foot 2 Views Right  Result Date: 09/15/2022 CLINICAL DATA:  Fell 8 feet from ladder EXAM: RIGHT FOOT - 2 VIEW COMPARISON:  None Available. FINDINGS: Frontal and lateral views of the right foot are obtained. There are no acute displaced fractures. Joint spaces are well preserved. Soft tissues are unremarkable. IMPRESSION: 1. Unremarkable right foot. Electronically Signed   By: Sharlet Salina M.D.   On: 09/15/2022 10:45   DG Tibia/Fibula Right  Result Date: 09/15/2022 CLINICAL DATA:  A 42 year old male presents with fall from ladder. EXAM:  RIGHT TIBIA AND FIBULA - 2 VIEW COMPARISON:  None available FINDINGS: Comminuted fracture of the proximal fibular diaphysis with over riding and angulation. Approximately 2-3 cm of over riding at the fracture site. Distal spiral tibial fracture with shaft with medial displacement of distal fracture fragment and 2-3 cm of overriding as well at this level. Apex lateral angulation at the fracture site. Fracture occurs in the distal tibial diaphysis. Extensive soft tissue swelling surrounds sites of fracture and is generalized throughout the lower extremity. IMPRESSION: 1. Comminuted fracture of the proximal fibula with over riding and displacement associated with spiral fracture of the distal tibia with moderate to marked displacement, mild angulation and overriding, foreshortening the LEFT lower leg and associated with extensive soft tissue swelling. Electronically Signed   By: Donzetta Kohut M.D.   On: 09/15/2022 09:34   DG Pelvis 1-2 Views  Result Date: 09/15/2022 CLINICAL DATA:  Fall from ladder yesterday, pain EXAM: PELVIS - 1-2 VIEW COMPARISON:  None Available. FINDINGS: There is no evidence of displaced pelvic fracture or diastasis. No pelvic bone lesions are seen. IMPRESSION: No displaced fracture or dislocation of the pelvis or bilateral proximal femurs seen in single frontal view. Please note that plain radiographs are significantly insensitive for hip and pelvic fracture. Recommend CT to more sensitively evaluate if there is high clinical suspicion for fracture. Electronically Signed   By: Jearld Lesch M.D.   On: 09/15/2022 09:32      Assessment/Plan Principal Problem:   Closed fracture of proximal fibula_right Active Problems:   Closed fracture of distal tibia_right   Fall at home, initial encounter   COPD (chronic obstructive pulmonary disease) (HCC)   HLD (hyperlipidemia)   Depression with anxiety   Abnormal LFTs   AA (alcohol abuse)   Tobacco use disorder   Assessment and  Plan:  Closed fracture of proximal fibula_right and closed fracture of distal tibia_right: Patient has moderate pain now. No neurovascular compromise. Orthopedic surgeon, Dr. Odis Luster isconsulted.   - will admit to Med-surg bed - Pain control: prn dilaudid, percocet and tyleno - When necessary Zofran for nausea - Robaxin for muscle spasm - Lidoderm patch for pain - type and cross - INR/PTT - PT/OT when able to (not ordered now)   Leukocytosis: mild, WBC 11.8, Likely due  to stress-induced demargination. Patient does not have signs of infection. -Follow-up CBC  Fall at home, initial encounter -PT/OT when able to  COPD (chronic obstructive pulmonary disease) ([place]): Stable - bronchodilators  Hyperlipidemia: -Crestor  Depression with anxiety -Continue home medications  Abnormal LFTs: Mild, ALP 91, AST 65, ALT 79, total bilirubin 0.8, likely due to alcohol abuse -Avoid using Tylenol  AA (alcohol abuse) and Tobacco use disorder: -Nicotine patch -CIWA protocol   Presurgical clearance: Patient is young 42 year old, no history of cardiac issues.  No further tests needed.  Patient is cleared for surgery   DVT ppx: SCD  Code Status: Full code  Family Communication: I called his mother who did not pick up the phone.  I left a message to her.  Disposition Plan:  Anticipate discharge back to previous environment  Consults called:  Dr. Harlow Mares of ortho  Admission status and Level of care: Med-Surg:    as inpt       Dispo: The patient is from: Home              Anticipated d/c is to:  To be determined              Anticipated d/c date is: 2 days              Patient currently is not medically stable to d/c.    Severity of Illness:  The appropriate patient status for this patient is INPATIENT. Inpatient status is judged to be reasonable and necessary in order to provide the required intensity of service to ensure the patient's safety. The patient's presenting symptoms,  physical exam findings, and initial radiographic and laboratory data in the context of their chronic comorbidities is felt to place them at high risk for further clinical deterioration. Furthermore, it is not anticipated that the patient will be medically stable for discharge from the hospital within 2 midnights of admission.   * I certify that at the point of admission it is my clinical judgment that the patient will require inpatient hospital care spanning beyond 2 midnights from the point of admission due to high intensity of service, high risk for further deterioration and high frequency of surveillance required.*       Date of Service 09/15/2022    Ivor Costa [facility]   If 7PM-7AM, please contact night-coverage [email or web address] 09/15/2022, 5:23 PM

## 2022-09-15 NOTE — Progress Notes (Signed)
Pt admitted to room 134, accompanied by mother. Alert and oriented, able to answer questions appropriately. Oriented to room and unit, as well as fall precaution protocol.

## 2022-09-15 NOTE — ED Triage Notes (Signed)
Presents via EMS from home  States he fell from ladder approx 8 ft  Injury to right ankle   Swelling and questionable deformity

## 2022-09-16 ENCOUNTER — Encounter: Payer: Self-pay | Admitting: Internal Medicine

## 2022-09-16 ENCOUNTER — Inpatient Hospital Stay: Payer: Medicaid Other

## 2022-09-16 ENCOUNTER — Inpatient Hospital Stay: Payer: Medicaid Other | Admitting: General Practice

## 2022-09-16 ENCOUNTER — Encounter
Admission: EM | Disposition: A | Discharge status: Home Health | Payer: Self-pay | Source: Home / Self Care | Attending: Obstetrics and Gynecology

## 2022-09-16 DIAGNOSIS — S82832A Other fracture of upper and lower end of left fibula, initial encounter for closed fracture: Secondary | ICD-10-CM

## 2022-09-16 HISTORY — PX: TIBIA IM NAIL INSERTION: SHX2516

## 2022-09-16 LAB — SURGICAL PCR SCREEN
MRSA, PCR: POSITIVE — AB
Staphylococcus aureus: POSITIVE — AB

## 2022-09-16 LAB — BASIC METABOLIC PANEL
Anion gap: 8 (ref 5–15)
BUN: 14 mg/dL (ref 6–20)
CO2: 28 mmol/L (ref 22–32)
Calcium: 8.6 mg/dL — ABNORMAL LOW (ref 8.9–10.3)
Chloride: 98 mmol/L (ref 98–111)
Creatinine, Ser: 1.07 mg/dL (ref 0.61–1.24)
GFR, Estimated: >60 mL/min (ref >60)
Glucose, Bld: 110 mg/dL — ABNORMAL HIGH (ref 70–99)
Potassium: 4.4 mmol/L (ref 3.5–5.1)
Sodium: 134 mmol/L — ABNORMAL LOW (ref 135–145)

## 2022-09-16 LAB — CBC
HCT: 40.3 % (ref 39.0–52.0)
Hemoglobin: 13.8 g/dL (ref 13.0–17.0)
MCH: 32.2 pg (ref 26.0–34.0)
MCHC: 34.2 g/dL (ref 30.0–36.0)
MCV: 93.9 fL (ref 80.0–100.0)
Platelets: 215 10*3/uL (ref 150–400)
RBC: 4.29 MIL/uL (ref 4.22–5.81)
RDW: 12 % (ref 11.5–15.5)
WBC: 7.7 10*3/uL (ref 4.0–10.5)
nRBC: 0 % (ref 0.0–0.2)

## 2022-09-16 SURGERY — INSERTION, INTRAMEDULLARY ROD, TIBIA
Anesthesia: General | Site: Leg Lower | Laterality: Right

## 2022-09-16 MED ORDER — CEFAZOLIN SODIUM-DEXTROSE 2-4 GM/100ML-% IV SOLN
INTRAVENOUS | Status: AC
  Filled 2022-09-16: qty 100

## 2022-09-16 MED ORDER — OXYCODONE-ACETAMINOPHEN 5-325 MG PO TABS
ORAL_TABLET | ORAL | Status: AC
  Filled 2022-09-16: qty 1

## 2022-09-16 MED ORDER — ZOLPIDEM TARTRATE 5 MG PO TABS
5.0000 mg | ORAL_TABLET | Freq: Every evening | ORAL | Status: DC | PRN
  Administered 2022-09-17 (×2): 5 mg via ORAL
  Filled 2022-09-16 (×2): qty 1

## 2022-09-16 MED ORDER — FENTANYL CITRATE (PF) 100 MCG/2ML IJ SOLN
INTRAMUSCULAR | Status: AC
  Filled 2022-09-16: qty 2

## 2022-09-16 MED ORDER — MIDAZOLAM HCL 2 MG/2ML IJ SOLN
INTRAMUSCULAR | Status: DC | PRN
  Administered 2022-09-16: 1 mg via INTRAVENOUS

## 2022-09-16 MED ORDER — ROCURONIUM BROMIDE 10 MG/ML (PF) SYRINGE
PREFILLED_SYRINGE | INTRAVENOUS | Status: AC
  Filled 2022-09-16: qty 20

## 2022-09-16 MED ORDER — DIPHENHYDRAMINE HCL 12.5 MG/5ML PO ELIX
12.5000 mg | ORAL_SOLUTION | ORAL | Status: DC | PRN
  Administered 2022-09-16 – 2022-09-17 (×2): 25 mg via ORAL
  Filled 2022-09-16 (×2): qty 10

## 2022-09-16 MED ORDER — POLYETHYLENE GLYCOL 3350 17 G PO PACK
17.0000 g | PACK | Freq: Every day | ORAL | Status: DC
  Administered 2022-09-16 – 2022-09-17 (×2): 17 g via ORAL
  Filled 2022-09-16 (×2): qty 1

## 2022-09-16 MED ORDER — DEXAMETHASONE SODIUM PHOSPHATE 10 MG/ML IJ SOLN
INTRAMUSCULAR | Status: AC
  Filled 2022-09-16: qty 1

## 2022-09-16 MED ORDER — CEFAZOLIN SODIUM-DEXTROSE 2-4 GM/100ML-% IV SOLN
2.0000 g | INTRAVENOUS | Status: AC
  Administered 2022-09-16: 2 g via INTRAVENOUS

## 2022-09-16 MED ORDER — ONDANSETRON HCL 4 MG PO TABS: 4.0000 mg | ORAL_TABLET | Freq: Four times a day (QID) | ORAL | Status: DC | PRN

## 2022-09-16 MED ORDER — MIDAZOLAM HCL 2 MG/2ML IJ SOLN
INTRAMUSCULAR | Status: AC
  Filled 2022-09-16: qty 2

## 2022-09-16 MED ORDER — SODIUM CHLORIDE 0.9 % IR SOLN
Status: DC | PRN
  Administered 2022-09-16: 1004 mL

## 2022-09-16 MED ORDER — IRRISEPT - 450ML BOTTLE WITH 0.05% CHG IN STERILE WATER, USP 99.95% OPTIME
TOPICAL | Status: DC | PRN
  Administered 2022-09-16: 450 mL

## 2022-09-16 MED ORDER — METOCLOPRAMIDE HCL 5 MG PO TABS: 5.0000 mg | ORAL_TABLET | Freq: Three times a day (TID) | ORAL | Status: DC | PRN

## 2022-09-16 MED ORDER — ROCURONIUM BROMIDE 100 MG/10ML IV SOLN
INTRAVENOUS | Status: DC | PRN
  Administered 2022-09-16: 10 mg via INTRAVENOUS
  Administered 2022-09-16: 30 mg via INTRAVENOUS
  Administered 2022-09-16 (×2): 20 mg via INTRAVENOUS
  Administered 2022-09-16: 50 mg via INTRAVENOUS

## 2022-09-16 MED ORDER — MAGNESIUM CITRATE PO SOLN: 1.0000 | Freq: Once | ORAL | Status: DC | PRN

## 2022-09-16 MED ORDER — BUPIVACAINE-EPINEPHRINE (PF) 0.5% -1:200000 IJ SOLN
INTRAMUSCULAR | Status: DC | PRN
  Administered 2022-09-16: 10 mL

## 2022-09-16 MED ORDER — ONDANSETRON HCL 4 MG/2ML IJ SOLN: 4.0000 mg | Freq: Once | INTRAMUSCULAR | Status: DC | PRN

## 2022-09-16 MED ORDER — METHOCARBAMOL 1000 MG/10ML IJ SOLN: 500.0000 mg | Freq: Four times a day (QID) | INTRAVENOUS | Status: DC | PRN

## 2022-09-16 MED ORDER — ONDANSETRON HCL 4 MG/2ML IJ SOLN: 4.0000 mg | Freq: Four times a day (QID) | INTRAMUSCULAR | Status: DC | PRN

## 2022-09-16 MED ORDER — LABETALOL HCL 5 MG/ML IV SOLN
INTRAVENOUS | Status: AC
  Filled 2022-09-16: qty 4

## 2022-09-16 MED ORDER — LABETALOL HCL 5 MG/ML IV SOLN
10.0000 mg | Freq: Once | INTRAVENOUS | Status: AC
  Administered 2022-09-16: 10 mg via INTRAVENOUS

## 2022-09-16 MED ORDER — LACTATED RINGERS IV SOLN: INTRAVENOUS | Status: DC

## 2022-09-16 MED ORDER — TRANEXAMIC ACID-NACL 1000-0.7 MG/100ML-% IV SOLN
1000.0000 mg | INTRAVENOUS | Status: AC
  Administered 2022-09-16: 1000 mg via INTRAVENOUS

## 2022-09-16 MED ORDER — LIDOCAINE HCL (PF) 2 % IJ SOLN
INTRAMUSCULAR | Status: AC
  Filled 2022-09-16: qty 5

## 2022-09-16 MED ORDER — BISACODYL 10 MG RE SUPP: 10.0000 mg | Freq: Every day | RECTAL | Status: DC | PRN

## 2022-09-16 MED ORDER — KETAMINE HCL 10 MG/ML IJ SOLN
INTRAMUSCULAR | Status: DC | PRN
  Administered 2022-09-16: 20 mg via INTRAVENOUS

## 2022-09-16 MED ORDER — BUPIVACAINE-EPINEPHRINE (PF) 0.5% -1:200000 IJ SOLN
INTRAMUSCULAR | Status: AC
  Filled 2022-09-16: qty 30

## 2022-09-16 MED ORDER — DEXMEDETOMIDINE HCL IN NACL 80 MCG/20ML IV SOLN
INTRAVENOUS | Status: DC | PRN
  Administered 2022-09-16: 8 ug via BUCCAL

## 2022-09-16 MED ORDER — TRANEXAMIC ACID-NACL 1000-0.7 MG/100ML-% IV SOLN
INTRAVENOUS | Status: AC
  Filled 2022-09-16: qty 100

## 2022-09-16 MED ORDER — PROPOFOL 10 MG/ML IV BOLUS
INTRAVENOUS | Status: AC
  Filled 2022-09-16: qty 20

## 2022-09-16 MED ORDER — VANCOMYCIN HCL 1000 MG IV SOLR
INTRAVENOUS | Status: AC
  Filled 2022-09-16: qty 20

## 2022-09-16 MED ORDER — METHOCARBAMOL 500 MG PO TABS
500.0000 mg | ORAL_TABLET | Freq: Four times a day (QID) | ORAL | Status: DC | PRN
  Administered 2022-09-17 – 2022-09-18 (×4): 500 mg via ORAL
  Filled 2022-09-16 (×4): qty 1

## 2022-09-16 MED ORDER — ONDANSETRON HCL 4 MG/2ML IJ SOLN
INTRAMUSCULAR | Status: AC
  Filled 2022-09-16: qty 2

## 2022-09-16 MED ORDER — CHLORHEXIDINE GLUCONATE CLOTH 2 % EX PADS
6.0000 | MEDICATED_PAD | Freq: Every day | CUTANEOUS | Status: DC
  Administered 2022-09-16 – 2022-09-18 (×2): 6 via TOPICAL

## 2022-09-16 MED ORDER — AMLODIPINE BESYLATE 5 MG PO TABS
5.0000 mg | ORAL_TABLET | Freq: Every day | ORAL | Status: DC
  Administered 2022-09-16 – 2022-09-18 (×3): 5 mg via ORAL
  Filled 2022-09-16 (×3): qty 1

## 2022-09-16 MED ORDER — FENTANYL CITRATE (PF) 100 MCG/2ML IJ SOLN
INTRAMUSCULAR | Status: AC
  Administered 2022-09-16: 25 ug via INTRAVENOUS
  Filled 2022-09-16: qty 2

## 2022-09-16 MED ORDER — ORAL CARE MOUTH RINSE: 15.0000 mL | Freq: Once | OROMUCOSAL | Status: AC

## 2022-09-16 MED ORDER — SEVOFLURANE IN SOLN
RESPIRATORY_TRACT | Status: AC
  Filled 2022-09-16: qty 250

## 2022-09-16 MED ORDER — KETAMINE HCL 50 MG/5ML IJ SOSY
PREFILLED_SYRINGE | INTRAMUSCULAR | Status: AC
  Filled 2022-09-16: qty 5

## 2022-09-16 MED ORDER — DOCUSATE SODIUM 100 MG PO CAPS
100.0000 mg | ORAL_CAPSULE | Freq: Two times a day (BID) | ORAL | Status: DC
  Administered 2022-09-16 – 2022-09-17 (×3): 100 mg via ORAL
  Filled 2022-09-16 (×3): qty 1

## 2022-09-16 MED ORDER — 0.9 % SODIUM CHLORIDE (POUR BTL) OPTIME
TOPICAL | Status: DC | PRN
  Administered 2022-09-16: 500 mL

## 2022-09-16 MED ORDER — CHLORHEXIDINE GLUCONATE 0.12 % MT SOLN: 15.0000 mL | Freq: Once | OROMUCOSAL | Status: AC

## 2022-09-16 MED ORDER — FENTANYL CITRATE (PF) 100 MCG/2ML IJ SOLN
25.0000 ug | INTRAMUSCULAR | Status: DC | PRN
  Administered 2022-09-16 (×4): 25 ug via INTRAVENOUS

## 2022-09-16 MED ORDER — PHENYLEPHRINE HCL (PRESSORS) 10 MG/ML IV SOLN
INTRAVENOUS | Status: DC | PRN
  Administered 2022-09-16: 80 ug via INTRAVENOUS
  Administered 2022-09-16: 40 ug via INTRAVENOUS

## 2022-09-16 MED ORDER — SUGAMMADEX SODIUM 200 MG/2ML IV SOLN
INTRAVENOUS | Status: DC | PRN
  Administered 2022-09-16: 200 mg via INTRAVENOUS

## 2022-09-16 MED ORDER — DEXAMETHASONE SODIUM PHOSPHATE 10 MG/ML IJ SOLN
INTRAMUSCULAR | Status: DC | PRN
  Administered 2022-09-16: 10 mg via INTRAVENOUS

## 2022-09-16 MED ORDER — DEXTROSE-NACL 5-0.45 % IV SOLN: INTRAVENOUS | Status: DC

## 2022-09-16 MED ORDER — VANCOMYCIN HCL 1000 MG IV SOLR
INTRAVENOUS | Status: DC | PRN
  Administered 2022-09-16: 1000 mg via INTRAVENOUS

## 2022-09-16 MED ORDER — METOCLOPRAMIDE HCL 5 MG/ML IJ SOLN: 5.0000 mg | Freq: Three times a day (TID) | INTRAMUSCULAR | Status: DC | PRN

## 2022-09-16 MED ORDER — ONDANSETRON HCL 4 MG/2ML IJ SOLN
INTRAMUSCULAR | Status: DC | PRN
  Administered 2022-09-16: 4 mg via INTRAVENOUS

## 2022-09-16 MED ORDER — ENSURE ENLIVE PO LIQD
237.0000 mL | Freq: Two times a day (BID) | ORAL | Status: DC
  Administered 2022-09-17: 237 mL via ORAL

## 2022-09-16 MED ORDER — MUPIROCIN 2 % EX OINT: 1.0000 | TOPICAL_OINTMENT | Freq: Two times a day (BID) | CUTANEOUS | Status: DC

## 2022-09-16 MED ORDER — LIDOCAINE HCL (CARDIAC) PF 100 MG/5ML IV SOSY
PREFILLED_SYRINGE | INTRAVENOUS | Status: DC | PRN
  Administered 2022-09-16: 50 mg via INTRAVENOUS
  Administered 2022-09-16: 100 mg via INTRAVENOUS

## 2022-09-16 MED ORDER — PAROXETINE HCL 20 MG PO TABS
20.0000 mg | ORAL_TABLET | Freq: Every day | ORAL | Status: DC
  Administered 2022-09-16 – 2022-09-17 (×2): 20 mg via ORAL
  Filled 2022-09-16 (×2): qty 1

## 2022-09-16 MED ORDER — PROPOFOL 10 MG/ML IV BOLUS
INTRAVENOUS | Status: DC | PRN
  Administered 2022-09-16: 200 mg via INTRAVENOUS

## 2022-09-16 MED ORDER — VANCOMYCIN HCL IN DEXTROSE 1-5 GM/200ML-% IV SOLN
1000.0000 mg | Freq: Two times a day (BID) | INTRAVENOUS | Status: AC
  Administered 2022-09-17: 1000 mg via INTRAVENOUS
  Filled 2022-09-16: qty 200

## 2022-09-16 MED ORDER — POLYETHYLENE GLYCOL 3350 17 G PO PACK: 17.0000 g | PACK | Freq: Every day | ORAL | Status: DC | PRN

## 2022-09-16 MED ORDER — FENTANYL CITRATE (PF) 100 MCG/2ML IJ SOLN
INTRAMUSCULAR | Status: DC | PRN
  Administered 2022-09-16 (×2): 50 ug via INTRAVENOUS
  Administered 2022-09-16: 100 ug via INTRAVENOUS

## 2022-09-16 MED ORDER — CHLORHEXIDINE GLUCONATE 0.12 % MT SOLN
OROMUCOSAL | Status: AC
  Administered 2022-09-16: 15 mL via OROMUCOSAL
  Filled 2022-09-16: qty 15

## 2022-09-16 SURGICAL SUPPLY — 54 items
BIT DRILL CALIBRATED 4.2 (BIT) IMPLANT
BIT DRILL FLEXIBLE LONG 12 (BIT) IMPLANT
BIT DRILL SHORT 4.2 (BIT) IMPLANT
BNDG ELASTIC 4X5.8 VLCR STR LF (GAUZE/BANDAGES/DRESSINGS) IMPLANT
BNDG ELASTIC 6X5.8 VLCR STR LF (GAUZE/BANDAGES/DRESSINGS) IMPLANT
BRUSH SCRUB EZ  4% CHG (MISCELLANEOUS) ×1
BRUSH SCRUB EZ 4% CHG (MISCELLANEOUS) ×1 IMPLANT
CHLORAPREP W/TINT 26 (MISCELLANEOUS) ×1 IMPLANT
DRAPE 3/4 80X56 (DRAPES) ×1 IMPLANT
DRAPE C-ARM XRAY 36X54 (DRAPES) ×1 IMPLANT
DRAPE C-ARMOR (DRAPES) IMPLANT
DRAPE U-SHAPE 47X51 STRL (DRAPES) ×1 IMPLANT
DRILL BIT CALIBRATED 4.2 (BIT) ×1
DRILL BIT SHORT 4.2 (BIT) ×2
DRSG AQUACEL AG ADV 3.5X 4 (GAUZE/BANDAGES/DRESSINGS) IMPLANT
DRSG AQUACEL AG ADV 3.5X10 (GAUZE/BANDAGES/DRESSINGS) IMPLANT
ELECT REM PT RETURN 9FT ADLT (ELECTROSURGICAL) ×1
ELECTRODE REM PT RTRN 9FT ADLT (ELECTROSURGICAL) ×1 IMPLANT
GAUZE XEROFORM 1X8 LF (GAUZE/BANDAGES/DRESSINGS) ×1 IMPLANT
GLOVE BIO SURGEON STRL SZ8 (GLOVE) ×1 IMPLANT
GLOVE BIOGEL PI IND STRL 8.5 (GLOVE) ×2 IMPLANT
GLOVE SURG ORTHO 8.5 STRL (GLOVE) ×3 IMPLANT
GOWN STRL REUS W/ TWL LRG LVL3 (GOWN DISPOSABLE) ×2 IMPLANT
GOWN STRL REUS W/ TWL XL LVL3 (GOWN DISPOSABLE) ×1 IMPLANT
GOWN STRL REUS W/TWL LRG LVL3 (GOWN DISPOSABLE) ×2
GOWN STRL REUS W/TWL XL LVL3 (GOWN DISPOSABLE) ×1
GUIDEWIRE 3.2X400 (WIRE) IMPLANT
JET LAVAGE IRRISEPT WOUND (IRRIGATION / IRRIGATOR) ×1
KIT TURNOVER KIT A (KITS) ×1 IMPLANT
LAVAGE JET IRRISEPT WOUND (IRRIGATION / IRRIGATOR) IMPLANT
MANIFOLD NEPTUNE II (INSTRUMENTS) ×1 IMPLANT
MAT ABSORB  FLUID 56X50 GRAY (MISCELLANEOUS) ×1
MAT ABSORB FLUID 56X50 GRAY (MISCELLANEOUS) ×1 IMPLANT
NAIL TIB TFNA 9X345 (Nail) IMPLANT
NDL HYPO 25X1 1.5 SAFETY (NEEDLE) ×1 IMPLANT
NEEDLE HYPO 25X1 1.5 SAFETY (NEEDLE) ×1 IMPLANT
NS IRRIG 1000ML POUR BTL (IV SOLUTION) ×1 IMPLANT
PACK TOTAL KNEE (MISCELLANEOUS) ×1 IMPLANT
PULSAVAC PLUS IRRIG FAN TIP (DISPOSABLE) ×1
REAMER ROD DEEP FLUTE 2.5X950 (INSTRUMENTS) IMPLANT
SCREW LOCK IM 5X36 (Screw) ×1 IMPLANT
SCREW LOCK IM 5X48 (Screw) IMPLANT
SCREW LOCK IM 5X54 (Screw) IMPLANT
SCREW LOCK IM 5X60 (Screw) IMPLANT
SCREW LOCK IM NAIL 5X34 (Screw) IMPLANT
SCREW LOCK X25 36X5X IM NL (Screw) IMPLANT
STAPLER SKIN PROX 35W (STAPLE) ×1 IMPLANT
SUT VIC AB 0 CT1 36 (SUTURE) ×1 IMPLANT
SUT VIC AB 2-0 CT1 18 (SUTURE) ×1 IMPLANT
SYR 10ML LL (SYRINGE) ×1 IMPLANT
TIP FAN IRRIG PULSAVAC PLUS (DISPOSABLE) IMPLANT
TOWEL OR 17X26 4PK STRL BLUE (TOWEL DISPOSABLE) ×2 IMPLANT
TRAP FLUID SMOKE EVACUATOR (MISCELLANEOUS) ×1 IMPLANT
WATER STERILE IRR 500ML POUR (IV SOLUTION) ×1 IMPLANT

## 2022-09-16 NOTE — Op Note (Signed)
Expand All Collapse All     09/16/2022  3:34 PM  PATIENT:  Jonathan [name]   MRN: [id number]  PRE-OPERATIVE DIAGNOSIS:  TIBIAL FRACTURE  POST-OPERATIVE DIAGNOSIS:  TIBIAL FRACTURE  PROCEDURE:  Procedure(s): RIGHT INTRAMEDULLARY (IM) NAIL TIBIAL  SURGEON:  Elyn Aquas. Harlow Mares, MD  ASSIST: Carlynn Spry, PA-C  ANESTHESIA:   Spinal   PREOPERATIVE INDICATIONS:  The patient  has a diagnosis of displaced and unstable tibia/ fibula fractures who elected for surgical management after discussion with the patient   about the options between surgery and cast management of the fractures.   The risks benefits and alternatives were discussed with the patient preoperatively including but not limited to the risks of infection, bleeding, nerve injury, cardiopulmonary complications, the need for revision surgery, among others, and the patient was willing to proceed.  OPERATIVE IMPLANTS: Synthes tibial nail advanced,   345 mm    9 mm  OPERATIVE FINDINGS: unstable, closed, displaced, comminuted fracture of the tibial shaft and proximal fibula  COMPLICATIONS: None  EBL: 100 mL      REPLACED: None  OPERATIVE PROCEDURE: The patient was brought to the operating room and underwent spinal anesthesia without complications and then placed on the operating room table and positioned appropriately.  The operative leg was prepped and draped in a sterile fashion.  Tourniquet was not used.  IV anti-biotics were given.  The leg was placed on the tibial reduction triangle and the foot immobilized with Coban and traction and rotational corrections were made.  A proximal medial incision was made along the patellar tendon.  Dissection was carried out bluntly through subcutaneous tissue and the fascia was divided.  A guidepin was introduced into the proximal tibia under fluoroscopic control and seen to be in good alignment and position.  Large drill was introduced to open the the proximal tibia.  A guidepin was then passed down  the shaft of the tibia and across the fracture site down to the lower end of the tibia.  The shaft was then sequentially reamed to  10 mm.  The above listed Synthes nail was introduced and passed across the fracture site and fully seated in the tibia.  Fluoroscopy showed excellent position of the nail and that the fracture had been well reduced.  Length was excellent.  Proximally, fixation was obtained with  two  5.0 screw(s).   Fluoroscopy showed good position and length.  Distally medial/ lateral screws were introduced and seated fully. Fluoroscopy showed good position and length.  The wounds were then irrigated.  The knee fascia was closed with 0 Vicryls and the subcutaneous tissue was closed with 2-0 Vicryls.  All wounds were closed with staples.  Xeroform covered by dressing sponges and asterile dressing was applied. Sponge and needle counts were correct. The patient was transferred to the hospital bed and taken to recovery room in good condition.  Elyn Aquas. Harlow Mares, MD

## 2022-09-16 NOTE — Anesthesia Postprocedure Evaluation (Signed)
Anesthesia Post Note  Patient: Jonathan [name]  Procedure(s) Performed: RIGHT INTRAMEDULLARY (IM) NAIL TIBIAL (Right: Leg Lower)  Patient location during evaluation: PACU Anesthesia Type: General Level of consciousness: awake and alert, oriented and patient cooperative Pain management: pain level controlled Vital Signs Assessment: post-procedure vital signs reviewed and stable Respiratory status: spontaneous breathing, nonlabored ventilation and respiratory function stable Cardiovascular status: blood pressure returned to baseline and stable Postop Assessment: adequate PO intake Anesthetic complications: no   No notable events documented.   Last Vitals:  Vitals:   09/16/22 1700 09/16/22 1729  BP: (!) 153/99 (!) 153/95  Pulse: 92 94  Resp: (!) 9 18  Temp: 37.2 C 36.8 C  SpO2: 92% 95%    Last Pain:  Vitals:   09/16/22 1729  TempSrc: Oral  PainSc:                  Darrin Nipper

## 2022-09-16 NOTE — Progress Notes (Signed)
Initial Nutrition Assessment  DOCUMENTATION CODES:   Not applicable  INTERVENTION:   -Once diet is advanced, add:   -Ensure Enlive po BID, each supplement provides 350 kcal and 20 grams of protein -MVI with minerals daily  NUTRITION DIAGNOSIS:   Increased nutrient needs related to post-op healing as evidenced by estimated needs.  GOAL:   Patient will meet greater than or equal to 90% of their needs  MONITOR:   PO intake, Supplement acceptance, Diet advancement  REASON FOR ASSESSMENT:   Consult Assessment of nutrition requirement/status, Hip fracture protocol  ASSESSMENT:   Pt with medical history significant of hear of hearing, alcohol abuse, tobacco abuse, HLD, COPD, depression with anxiety, panic disorder, pneumothorax, rib fracture, Barsony-Polgar syndrome, who presents with fall and right leg pain.  Pt admitted with closed rt proximal fibula and distal tibia.   Reviewed I/O's: -400 ml x 24 hours  UOP: 400 ml x 24 hours   Per orthopedics notes, plan for operative fixation today. Pt NPO for procedure.   Pt sleeping soundly at time of visit. He did not respond to name being called.   Reviewed wt hx; wt has been stable over the past year.   Medications reviewed and include folic acid, miralax, thiamine, and lactated ringers infusion @ 10 ml/hr.   Labs reviewed: Na: 134.    NUTRITION - FOCUSED PHYSICAL EXAM:  Flowsheet Row Most Recent Value  Orbital Region No depletion  Upper Arm Region No depletion  Thoracic and Lumbar Region No depletion  Buccal Region No depletion  Temple Region No depletion  Clavicle Bone Region No depletion  Clavicle and Acromion Bone Region No depletion  Scapular Bone Region No depletion  Dorsal Hand No depletion  Patellar Region No depletion  Anterior Thigh Region No depletion  Posterior Calf Region No depletion  Edema (RD Assessment) None  Hair Reviewed  Eyes Reviewed  Mouth Reviewed  Skin Reviewed  Nails Reviewed        Diet Order:   Diet Order             Diet NPO time specified Except for: Sips with Meds, Ice Chips  Diet effective midnight                   EDUCATION NEEDS:   No education needs have been identified at this time  Skin:  Skin Assessment: Reviewed RN Assessment  Last BM:  09/14/22  Height:   Ht Readings from Last 1 Encounters:  09/16/22 6' (1.829 m)    Weight:   Wt Readings from Last 1 Encounters:  09/16/22 99.8 kg    Ideal Body Weight:  80.9 kg  BMI:  Body mass index is 29.84 kg/m.  Estimated Nutritional Needs:   Kcal:  2200-2400  Protein:  120-135 grams  Fluid:  > 2 L    Loistine Chance, RD, LDN, [place] Registered Dietitian II Certified Diabetes Care and Education Specialist Please refer to [facility] for RD and/or RD on-call/weekend/after hours pager

## 2022-09-16 NOTE — Anesthesia Procedure Notes (Signed)
Procedure Name: Intubation Date/Time: 09/16/2022 1:39 PM  Performed by: Loni Dolly, CRNAPre-anesthesia Checklist: Patient identified, Emergency Drugs available, Suction available and Patient being monitored Patient Re-evaluated:Patient Re-evaluated prior to induction Oxygen Delivery Method: Circle system utilized Preoxygenation: Pre-oxygenation with 100% oxygen Induction Type: IV induction Ventilation: Mask ventilation without difficulty Laryngoscope Size: McGraph and 3 Grade View: Grade I Tube type: Oral Tube size: 7.5 mm Number of attempts: 1 Airway Equipment and Method: Stylet and Oral airway Placement Confirmation: ETT inserted through vocal cords under direct vision, positive ETCO2 and breath sounds checked- equal and bilateral Secured at: 22 cm Tube secured with: Tape Dental Injury: Teeth and Oropharynx as per pre-operative assessment

## 2022-09-16 NOTE — Anesthesia Preprocedure Evaluation (Signed)
Anesthesia Evaluation  Patient identified by MRN, date of birth, ID band Patient awake    Reviewed: Allergy & Precautions, NPO status , Patient's Chart, lab work & pertinent test results  Airway Mallampati: III  TM Distance: >3 FB Neck ROM: full    Dental  (+) Teeth Intact   Pulmonary neg pulmonary ROS, COPD, Current Smoker and Patient abstained from smoking.   Pulmonary exam normal  + decreased breath sounds      Cardiovascular Exercise Tolerance: Good negative cardio ROS Normal cardiovascular exam Rhythm:Regular Rate:Normal     Neuro/Psych   Anxiety Depression    negative neurological ROS  negative psych ROS   GI/Hepatic negative GI ROS, Neg liver ROS,GERD  Medicated,,  Endo/Other  negative endocrine ROS  Morbid obesity  Renal/GU negative Renal ROS     Musculoskeletal   Abdominal  (+) + obese  Peds negative pediatric ROS (+)  Hematology negative hematology ROS (+)   Anesthesia Other Findings Past Medical History: No date: Alcohol abuse No date: Anxiety No date: Deaf No date: Depression No date: Gastric reflux No date: Liver disease No date: Panic disorder  Past Surgical History: No date: COCHLEAR IMPLANT; Right No date: COLON SURGERY  BMI    Body Mass Index: 29.84 kg/m      Reproductive/Obstetrics negative OB ROS                             Anesthesia Physical Anesthesia Plan  ASA: 3  Anesthesia Plan: General   Post-op Pain Management:    Induction: Intravenous  PONV Risk Score and Plan: Ondansetron, Dexamethasone, Midazolam and Treatment may vary due to age or medical condition  Airway Management Planned: Oral ETT  Additional Equipment:   Intra-op Plan:   Post-operative Plan: Extubation in OR  Informed Consent: I have reviewed the patients History and Physical, chart, labs and discussed the procedure including the risks, benefits and alternatives for the  proposed anesthesia with the patient or authorized representative who has indicated his/her understanding and acceptance.     Dental Advisory Given  Plan Discussed with: CRNA and Surgeon  Anesthesia Plan Comments:        Anesthesia Quick Evaluation

## 2022-09-16 NOTE — Progress Notes (Addendum)
PROGRESS NOTE    Jonathan [name]  IRW:[id number] DOB: [birth year] DOA: 09/15/2022 PCP: Ellene Route     Brief Narrative:   From admission h and p  Jonathan [name] is a 42 y.o. male with medical history significant of hear of hearing, alcohol abuse, tobacco abuse, HLD, COPD, depression with anxiety, panic disorder, pneumothorax, rib fracture, Barsony-Polgar syndrome, who presents with fall and right leg pain.   Pt states that he accidentally fell yesterday from an 8 foot ladder, injuring his right leg, developed pain in right leg and ankle.  The pain is constant, severe, sharp, nonradiating.  He has not been able to ambulate since injury. He states he crawled inside the house.  He denies head or neck injury.  No loss of consciousness.  Patient does not have chest pain, cough, shortness breath.  No nausea vomiting, diarrhea or abdominal pain.  No symptoms of UTI.  Patient has chronic severe hard of hearing.  He has hearing aids. Patient admits to alcohol use but denies any recreational drugs.    Assessment & Plan:   Principal Problem:   Closed fracture of proximal fibula_right Active Problems:   Closed fracture of distal tibia_right   Fall at home, initial encounter   COPD (chronic obstructive pulmonary disease) (HCC)   HLD (hyperlipidemia)   Depression with anxiety   Abnormal LFTs   AA (alcohol abuse)   Tobacco use disorder  # Closed right tib/fib fracture No other apparent significant injury from fall from ladder, remainder of imaging including neuroimaging is negative. - ortho consulted, plan for operative repair later today - pt/ot consult tomorrow  # Alcohol abuse Denies history of withdrawal, no s/s withdrawal currently - monitor - vitamins  # COPD - quiescent  # HTN Patient reports not on home meds but interested in starting. Bp moderately elevated today. - start amlodipine 5 - cont home statin  # Sensorineural hearing loss Has cochlear implants  # GAD -  cont home paroxetine, valium     DVT prophylaxis: SCDs Code Status: full code Family Communication: mother updated @ bedside 1/31  Level of care: Med-Surg Status is: Inpatient Remains inpatient appropriate because: severity of illness    Consultants:  ortho  Procedures: pending  Antimicrobials:  Surgical ppx    Subjective: Right leg pain stable, no other complaints  Objective: Vitals:   09/16/22 0758 09/16/22 0854 09/16/22 0901 09/16/22 0911  BP: (!) 132/94  (!) 153/99   Pulse: (!) 111 (!) 102 (!) 108   Resp: 16  18   Temp: 98.6 F (37 C) 98.6 F (37 C) 98.6 F (37 C)   TempSrc:      SpO2: 94%  95% 91%  Weight:      Height:        Intake/Output Summary (Last 24 hours) at 09/16/2022 1156 Last data filed at 09/16/2022 7619 Gross per 24 hour  Intake --  Output 400 ml  Net -400 ml   Filed Weights   09/15/22 0853  Weight: 99.8 kg    Examination:  General exam: Appears calm and comfortable  Respiratory system: Clear to auscultation. Respiratory effort normal. Cardiovascular system: S1 & S2 heard, RRR. No JVD, murmurs, rubs, gallops or clicks. No pedal edema. Gastrointestinal system: Abdomen is nondistended, soft and nontender. No organomegaly or masses felt. Normal bowel sounds heard. Central nervous system: Alert and oriented. No focal neurological deficits. Extremities: splint right lower extremity Skin: No rashes, lesions or ulcers Psychiatry: Judgement and insight appear normal. Mood &  affect appropriate.     Data Reviewed: I have personally reviewed following labs and imaging studies  CBC: Recent Labs  Lab 09/15/22 0821 09/16/22 0558  WBC 11.8* 7.7  NEUTROABS 9.6*  --   HGB 17.1* 13.8  HCT 50.7 40.3  MCV 92.5 93.9  PLT 294 161   Basic Metabolic Panel: Recent Labs  Lab 09/15/22 0821 09/16/22 0558  NA 134* 134*  K 4.2 4.4  CL 99 98  CO2 21* 28  GLUCOSE 121* 110*  BUN 11 14  CREATININE 0.98 1.07  CALCIUM 8.7* 8.6*    GFR: Estimated Creatinine Clearance: 108.9 mL/min (by C-G formula based on SCr of 1.07 mg/dL). Liver Function Tests: Recent Labs  Lab 09/15/22 0821  AST 65*  ALT 79*  ALKPHOS 91  BILITOT 0.8  PROT 7.9  ALBUMIN 4.3   No results for input(s): "LIPASE", "AMYLASE" in the last 168 hours. No results for input(s): "AMMONIA" in the last 168 hours. Coagulation Profile: Recent Labs  Lab 09/15/22 1641  INR 1.0   Cardiac Enzymes: No results for input(s): "CKTOTAL", "CKMB", "CKMBINDEX", "TROPONINI" in the last 168 hours. BNP (last 3 results) No results for input(s): "PROBNP" in the last 8760 hours. HbA1C: No results for input(s): "HGBA1C" in the last 72 hours. CBG: No results for input(s): "GLUCAP" in the last 168 hours. Lipid Profile: No results for input(s): "CHOL", "HDL", "LDLCALC", "TRIG", "CHOLHDL", "LDLDIRECT" in the last 72 hours. Thyroid Function Tests: No results for input(s): "TSH", "T4TOTAL", "FREET4", "T3FREE", "THYROIDAB" in the last 72 hours. Anemia Panel: No results for input(s): "VITAMINB12", "FOLATE", "FERRITIN", "TIBC", "IRON", "RETICCTPCT" in the last 72 hours. Urine analysis:    Component Value Date/Time   COLORURINE YELLOW (A) 09/15/2022 0848   APPEARANCEUR HAZY (A) 09/15/2022 0848   APPEARANCEUR Hazy (A) 04/15/2021 1544   LABSPEC 1.015 09/15/2022 0848   LABSPEC 1.002 07/02/2012 1558   PHURINE 5.0 09/15/2022 0848   GLUCOSEU NEGATIVE 09/15/2022 0848   GLUCOSEU Negative 07/02/2012 1558   HGBUR NEGATIVE 09/15/2022 0848   BILIRUBINUR NEGATIVE 09/15/2022 0848   BILIRUBINUR Negative 04/15/2021 1544   BILIRUBINUR Negative 07/02/2012 1558   KETONESUR NEGATIVE 09/15/2022 0848   PROTEINUR NEGATIVE 09/15/2022 0848   UROBILINOGEN 1.0 10/16/2008 1501   NITRITE NEGATIVE 09/15/2022 0848   LEUKOCYTESUR SMALL (A) 09/15/2022 0848   LEUKOCYTESUR Negative 07/02/2012 1558   Sepsis Labs: @LABRCNTIP (procalcitonin:4,lacticidven:4)  ) Recent Results (from the past 240  hour(s))  Surgical PCR screen     Status: Abnormal   Collection Time: 09/16/22 12:40 AM   Specimen: Nasal Mucosa; Nasal Swab  Result Value Ref Range Status   MRSA, PCR POSITIVE (A) NEGATIVE Final    Comment: RESULT CALLED TO, READ BACK BY AND VERIFIED WITH: Murriel Hopper @0247  on 09/16/22 SKL    Staphylococcus aureus POSITIVE (A) NEGATIVE Final    Comment: (NOTE) The Xpert SA Assay (FDA approved for NASAL specimens in patients 72 years of age and older), is one component of a comprehensive surveillance program. It is not intended to diagnose infection nor to guide or monitor treatment. Performed at [facility], [address] [place], [place] [address]          Radiology Studies: CT HEAD WO CONTRAST (5MM)  Result Date: 09/15/2022 CLINICAL DATA:  Head trauma, moderate-severe Fall from ladder. EXAM: CT HEAD WITHOUT CONTRAST TECHNIQUE: Contiguous axial images were obtained from the base of the skull through the vertex without intravenous contrast. RADIATION DOSE REDUCTION: This exam was performed according to the departmental  dose-optimization program which includes automated exposure control, adjustment of the mA and/or kV according to patient size and/or use of iterative reconstruction technique. COMPARISON:  Head CT 06/21/2022 FINDINGS: Brain: Streak artifact from bilateral cochlear implants. No acute intracranial hemorrhage. No subdural or extra-axial collection. No hydrocephalus, midline shift or mass lesion/mass effect. Vascular: No hyperdense vessel or unexpected calcification. Skull: No fracture or focal lesion. Sinuses/Orbits: Bilateral cochlear implants. Minimal opacification of lower mastoid air cells, chronic. Mucosal thickening throughout the paranasal sinuses. Mucous retention cyst in the right maxillary sinus. Other: No confluent scalp hematoma. IMPRESSION: 1. No acute intracranial abnormality. No skull fracture. 2. Bilateral cochlear implants. Electronically Signed    By: Narda Rutherford M.D.   On: 09/15/2022 17:35   DG Femur Min 2 Views Right  Result Date: 09/15/2022 CLINICAL DATA:  Right leg pain after fall from ladder. EXAM: RIGHT FEMUR 2 VIEWS COMPARISON:  None Available. FINDINGS: There is no evidence of fracture or other focal bone lesions. Soft tissues are unremarkable. IMPRESSION: Negative. Electronically Signed   By: Lupita Raider M.D.   On: 09/15/2022 13:10   CT PELVIS WO CONTRAST  Result Date: 09/15/2022 CLINICAL DATA:  Larey Seat 8 feet from a ladder.  Right hip pain EXAM: CT PELVIS WITHOUT CONTRAST TECHNIQUE: Multidetector CT imaging of the pelvis was performed following the standard protocol without intravenous contrast. RADIATION DOSE REDUCTION: This exam was performed according to the departmental dose-optimization program which includes automated exposure control, adjustment of the mA and/or kV according to patient size and/or use of iterative reconstruction technique. COMPARISON:  Pelvis radiography same day. FINDINGS: Urinary Tract:  No evidence of urinary tract injury. Bowel:  No evidence of bowel injury. Vascular/Lymphatic: No significant vascular finding.  No adenopathy. Reproductive:  Normal Other:  No free fluid or air. Musculoskeletal: No evidence of sacral fracture. 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 No evidence of acute pelvic or hip fracture. IMPRESSION: 1. No acute or traumatic finding. 2. 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 Electronically Signed   By: Paulina Fusi M.D.   On: 09/15/2022 13:00   CT Lumbar Spine Wo Contrast  Result Date: 09/15/2022 CLINICAL DATA:  Larey Seat 8 feet from a ladder. EXAM: CT LUMBAR SPINE WITHOUT CONTRAST TECHNIQUE: Multidetector CT imaging of the lumbar spine was performed without intravenous contrast administration. Multiplanar CT image reconstructions were also generated. RADIATION DOSE REDUCTION: This exam was performed according to the  departmental dose-optimization program which includes automated exposure control, adjustment of the mA and/or kV according to patient size and/or use of iterative reconstruction technique. COMPARISON:  Lumbar radiography 07/02/2012.  CT 03/16/2021. FINDINGS: Segmentation: 5 lumbar type vertebral bodies. Alignment: Chronic spondylolisthesis at L5-S1 due to bilateral pars defects, measuring 7 mm. Vertebrae: No acute fracture. Chronic bilateral pars defects at L5. Chronic inferior endplate Schmorl's node at L4. Paraspinal and other soft tissues: Negative Disc levels: No significant finding at L3-4 or above. L4-5: Mild bulging of the disc.  No stenosis. L5-S1: Chronic bilateral pars defects with unchanged anterolisthesis at 7 mm. Disc degeneration and bulging. No canal stenosis. Bilateral foraminal narrowing with some potential to affect the exiting L5 nerves. IMPRESSION: 1. No acute or traumatic finding. 2. Chronic bilateral pars defects at L5 with 7 mm of anterolisthesis. Disc degeneration and bulging. No canal stenosis. Bilateral foraminal narrowing with some potential to affect the exiting L5 nerves. Electronically Signed   By: Paulina Fusi M.D.   On: 09/15/2022 12:57  DG Foot 2 Views Right  Result Date: 09/15/2022 CLINICAL DATA:  Fell 8 feet from ladder EXAM: RIGHT FOOT - 2 VIEW COMPARISON:  None Available. FINDINGS: Frontal and lateral views of the right foot are obtained. There are no acute displaced fractures. Joint spaces are well preserved. Soft tissues are unremarkable. IMPRESSION: 1. Unremarkable right foot. Electronically Signed   By: Randa Ngo M.D.   On: 09/15/2022 10:45   DG Tibia/Fibula Right  Result Date: 09/15/2022 CLINICAL DATA:  A 42 year old male presents with fall from ladder. EXAM: RIGHT TIBIA AND FIBULA - 2 VIEW COMPARISON:  None available FINDINGS: Comminuted fracture of the proximal fibular diaphysis with over riding and angulation. Approximately 2-3 cm of over riding at the  fracture site. Distal spiral tibial fracture with shaft with medial displacement of distal fracture fragment and 2-3 cm of overriding as well at this level. Apex lateral angulation at the fracture site. Fracture occurs in the distal tibial diaphysis. Extensive soft tissue swelling surrounds sites of fracture and is generalized throughout the lower extremity. IMPRESSION: 1. Comminuted fracture of the proximal fibula with over riding and displacement associated with spiral fracture of the distal tibia with moderate to marked displacement, mild angulation and overriding, foreshortening the LEFT lower leg and associated with extensive soft tissue swelling. Electronically Signed   By: Zetta Bills M.D.   On: 09/15/2022 09:34   DG Pelvis 1-2 Views  Result Date: 09/15/2022 CLINICAL DATA:  Fall from ladder yesterday, pain EXAM: PELVIS - 1-2 VIEW COMPARISON:  None Available. FINDINGS: There is no evidence of displaced pelvic fracture or diastasis. No pelvic bone lesions are seen. IMPRESSION: No displaced fracture or dislocation of the pelvis or bilateral proximal femurs seen in single frontal view. Please note that plain radiographs are significantly insensitive for hip and pelvic fracture. Recommend CT to more sensitively evaluate if there is high clinical suspicion for fracture. Electronically Signed   By: Delanna Ahmadi M.D.   On: 09/15/2022 09:32        Scheduled Meds:  Chlorhexidine Gluconate Cloth  6 each Topical H7416   folic acid  1 mg Oral Daily   lidocaine  1 patch Transdermal Q24H   mometasone-formoterol  2 puff Inhalation BID   multivitamin with minerals  1 tablet Oral Daily   mupirocin ointment  1 Application Nasal BID   nicotine  21 mg Transdermal Daily   rosuvastatin  10 mg Oral Daily   thiamine  100 mg Oral Daily   Or   thiamine  100 mg Intravenous Daily   Continuous Infusions:   ceFAZolin (ANCEF) IV     tranexamic acid       LOS: 1 day     Desma Maxim, MD [facility]   If 7PM-7AM, please contact night-coverage [email or web address] Password [facility] 09/16/2022, 11:56 AM

## 2022-09-16 NOTE — Transfer of Care (Signed)
Immediate Anesthesia Transfer of Care Note  Patient: Jonathan [name]  Procedure(s) Performed: RIGHT INTRAMEDULLARY (IM) NAIL TIBIAL (Right: Leg Lower)  Patient Location: PACU  Anesthesia Type:General  Level of Consciousness: drowsy  Airway & Oxygen Therapy: Patient Spontanous Breathing and Patient connected to face mask oxygen  Post-op Assessment: Report given to RN and Post -op Vital signs reviewed and stable  Post vital signs: Reviewed and stable  Last Vitals:  Vitals Value Taken Time  BP 189/108 09/16/22 1548  Temp    Pulse 112 09/16/22 1552  Resp 15 09/16/22 1552  SpO2 100 % 09/16/22 1552  Vitals shown include unvalidated device data.  Last Pain:  Vitals:   09/16/22 1210  TempSrc: Oral  PainSc: 6       Patients Stated Pain Goal: 0 (22/97/98 9211)  Complications: No notable events documented.

## 2022-09-16 NOTE — Progress Notes (Signed)
   09/16/22 0033  Assess: MEWS Score  Temp 98.8 F (37.1 C)  BP (!) 153/91  MAP (mmHg) 107  Pulse Rate (!) 112  Resp 18  SpO2 96 %  O2 Device Room Air  Assess: MEWS Score  MEWS Temp 0  MEWS Systolic 0  MEWS Pulse 2  MEWS RR 0  MEWS LOC 0  MEWS Score 2  MEWS Score Color Yellow  Assess: if the MEWS score is Yellow or Red  Were vital signs taken at a resting state? Yes  Focused Assessment No change from prior assessment  Does the patient meet 2 or more of the SIRS criteria? No  MEWS guidelines implemented *See Row Information* No, vital signs rechecked  Treat  MEWS Interventions Administered prn meds/treatments  Pain Scale 0-10  Pain Score 8  Pain Type Acute pain  Pain Location Leg  Pain Orientation Right  Pain Descriptors / Indicators Throbbing  Pain Frequency Constant  Pain Onset On-going  Patients Stated Pain Goal 0  Pain Intervention(s) Medication (See eMAR)  Notify: Charge Nurse/RN  Name of Charge Nurse/RN Notified Dorie,RN  Date Charge Nurse/RN Notified 09/16/22  Time Charge Nurse/RN Notified 0035  Assess: SIRS CRITERIA  SIRS Temperature  0  SIRS Pulse 1  SIRS Respirations  0  SIRS WBC 0  SIRS Score Sum  1

## 2022-09-17 ENCOUNTER — Encounter: Payer: Self-pay | Admitting: Orthopedic Surgery

## 2022-09-17 LAB — COMPREHENSIVE METABOLIC PANEL
ALT: 43 U/L (ref 0–44)
AST: 38 U/L (ref 15–41)
Albumin: 3.6 g/dL (ref 3.5–5.0)
Alkaline Phosphatase: 74 U/L (ref 38–126)
Anion gap: 8 (ref 5–15)
BUN: 14 mg/dL (ref 6–20)
CO2: 27 mmol/L (ref 22–32)
Calcium: 8.5 mg/dL — ABNORMAL LOW (ref 8.9–10.3)
Chloride: 101 mmol/L (ref 98–111)
Creatinine, Ser: 1.01 mg/dL (ref 0.61–1.24)
GFR, Estimated: >60 mL/min (ref >60)
Glucose, Bld: 132 mg/dL — ABNORMAL HIGH (ref 70–99)
Potassium: 4.6 mmol/L (ref 3.5–5.1)
Sodium: 136 mmol/L (ref 135–145)
Total Bilirubin: 0.9 mg/dL (ref 0.3–1.2)
Total Protein: 6.8 g/dL (ref 6.5–8.1)

## 2022-09-17 LAB — CBC
HCT: 38.1 % — ABNORMAL LOW (ref 39.0–52.0)
Hemoglobin: 12.6 g/dL — ABNORMAL LOW (ref 13.0–17.0)
MCH: 31.6 pg (ref 26.0–34.0)
MCHC: 33.1 g/dL (ref 30.0–36.0)
MCV: 95.5 fL (ref 80.0–100.0)
Platelets: 204 10*3/uL (ref 150–400)
RBC: 3.99 MIL/uL — ABNORMAL LOW (ref 4.22–5.81)
RDW: 11.6 % (ref 11.5–15.5)
WBC: 11.6 10*3/uL — ABNORMAL HIGH (ref 4.0–10.5)
nRBC: 0 % (ref 0.0–0.2)

## 2022-09-17 MED ORDER — DOCUSATE SODIUM 100 MG PO CAPS: 100.0000 mg | ORAL_CAPSULE | Freq: Two times a day (BID) | ORAL | 0 refills | Status: AC

## 2022-09-17 MED ORDER — ASPIRIN 81 MG PO TBEC: 81.0000 mg | DELAYED_RELEASE_TABLET | Freq: Two times a day (BID) | ORAL | 0 refills | Status: DC

## 2022-09-17 MED ORDER — OXYCODONE-ACETAMINOPHEN 5-325 MG PO TABS
1.0000 | ORAL_TABLET | ORAL | Status: DC | PRN
  Administered 2022-09-17 – 2022-09-18 (×6): 2 via ORAL
  Filled 2022-09-17 (×6): qty 2

## 2022-09-17 MED ORDER — OXYCODONE HCL 10 MG PO TABS: 10.0000 mg | ORAL_TABLET | ORAL | 0 refills | Status: AC | PRN

## 2022-09-17 MED ORDER — METHOCARBAMOL 500 MG PO TABS: 500.0000 mg | ORAL_TABLET | Freq: Three times a day (TID) | ORAL | 1 refills | Status: AC | PRN

## 2022-09-17 MED ORDER — ENOXAPARIN SODIUM 40 MG/0.4ML IJ SOSY
40.0000 mg | PREFILLED_SYRINGE | INTRAMUSCULAR | Status: DC
  Administered 2022-09-17: 40 mg via SUBCUTANEOUS
  Filled 2022-09-17: qty 0.4

## 2022-09-17 NOTE — Plan of Care (Signed)

## 2022-09-17 NOTE — Plan of Care (Signed)
Patient sleeping between care. Aox4. Frequent request for pain medication overnight. Surgical dressing C/D/I.  Plan of care reviewed with patient and mother. Call bell within reach.   Problem: Education: Goal: Knowledge of General Education information will improve Description: Including pain rating scale, medication(s)/side effects and non-pharmacologic comfort measures Outcome: Progressing   Problem: Health Behavior/Discharge Planning: Goal: Ability to manage health-related needs will improve Outcome: Progressing   Problem: Clinical Measurements: Goal: Ability to maintain clinical measurements within normal limits will improve Outcome: Progressing Goal: Will remain free from infection Outcome: Progressing Goal: Diagnostic test results will improve Outcome: Progressing Goal: Respiratory complications will improve Outcome: Progressing Goal: Cardiovascular complication will be avoided Outcome: Progressing   Problem: Activity: Goal: Risk for activity intolerance will decrease Outcome: Progressing   Problem: Nutrition: Goal: Adequate nutrition will be maintained Outcome: Progressing   Problem: Coping: Goal: Level of anxiety will decrease Outcome: Progressing   Problem: Elimination: Goal: Will not experience complications related to bowel motility Outcome: Progressing Goal: Will not experience complications related to urinary retention Outcome: Progressing   Problem: Pain Managment: Goal: General experience of comfort will improve Outcome: Progressing   Problem: Safety: Goal: Ability to remain free from injury will improve Outcome: Progressing   Problem: Skin Integrity: Goal: Risk for impaired skin integrity will decrease Outcome: Progressing

## 2022-09-17 NOTE — Progress Notes (Signed)
   Inpatient Rehab Admissions Coordinator :  Per therapy change in recommendations patient was screened for CIR candidacy by Danne Baxter RN MSN. Patient does not appear to demonstrate the medical neccesity for a Gate /CIR admit. I will not place a Rehab Consult. Recommend other Rehab Venues to be pursued if unable to progress to go home. Please contact me with any questions.  Danne Baxter RN MSN Admissions Coordinator [phone]

## 2022-09-17 NOTE — Progress Notes (Signed)
Patient is not able to walk the distance required to go the bathroom, or he/she is unable to safely negotiate stairs required to access the bathroom.  A 3in1 BSC will alleviate this problem  

## 2022-09-17 NOTE — TOC Progression Note (Signed)
Transition of Care [facility]) - Progression Note    Patient Details  Name: Jonathan [name] MRN: [id number] Date of Birth: 14-Mar-1981  Transition of Care [facility]) CM/SW [place], LCSW Phone Number: 09/17/2022, 4:02 PM  Clinical Narrative:    Patient to discharge home with home health provided by [facility] (PTOT).  Patient to receive WC, BSC and RW through [facility].          Expected Discharge Plan and [place]                                             Social Determinants of Health (SDOH) Interventions SDOH Screenings   Food Insecurity: No Food Insecurity (09/15/2022)  Housing: Low Risk  (09/15/2022)  Transportation Needs: No Transportation Needs (09/15/2022)  Utilities: Not At Risk (09/15/2022)  Tobacco Use: High Risk (09/17/2022)    Readmission Risk Interventions     No data to display

## 2022-09-17 NOTE — Progress Notes (Signed)
Subjective:  Patient reports pain as moderate.    Objective:   VITALS:   Vitals:   09/16/22 1729 09/16/22 2033 09/16/22 2315 09/17/22 0523  BP: (!) 153/95 130/78 122/74 134/82  Pulse: 94 (!) 104 (!) 107 92  Resp: 18 17 17 17   Temp: 98.2 F (36.8 C) 98.1 F (36.7 C) 98.1 F (36.7 C) 98 F (36.7 C)  TempSrc: Oral     SpO2: 95% 92% 94% 99%  Weight:      Height:        PHYSICAL EXAM:  Neurologically intact ABD soft Neurovascular intact Sensation intact distally Intact pulses distally Dorsiflexion/Plantar flexion intact Incision: dressing C/D/I No cellulitis present Compartment soft  LABS  Results for orders placed or performed during the hospital encounter of 09/15/22 (from the past 24 hour(s))  Comprehensive metabolic panel     Status: Abnormal   Collection Time: 09/17/22  4:56 AM  Result Value Ref Range   Sodium 136 135 - 145 mmol/L   Potassium 4.6 3.5 - 5.1 mmol/L   Chloride 101 98 - 111 mmol/L   CO2 27 22 - 32 mmol/L   Glucose, Bld 132 (H) 70 - 99 mg/dL   BUN 14 6 - 20 mg/dL   Creatinine, Ser 1.01 0.61 - 1.24 mg/dL   Calcium 8.5 (L) 8.9 - 10.3 mg/dL   Total Protein 6.8 6.5 - 8.1 g/dL   Albumin 3.6 3.5 - 5.0 g/dL   AST 38 15 - 41 U/L   ALT 43 0 - 44 U/L   Alkaline Phosphatase 74 38 - 126 U/L   Total Bilirubin 0.9 0.3 - 1.2 mg/dL   GFR, Estimated >60 >60 mL/min   Anion gap 8 5 - 15  CBC     Status: Abnormal   Collection Time: 09/17/22  4:56 AM  Result Value Ref Range   WBC 11.6 (H) 4.0 - 10.5 K/uL   RBC 3.99 (L) 4.22 - 5.81 MIL/uL   Hemoglobin 12.6 (L) 13.0 - 17.0 g/dL   HCT 38.1 (L) 39.0 - 52.0 %   MCV 95.5 80.0 - 100.0 fL   MCH 31.6 26.0 - 34.0 pg   MCHC 33.1 30.0 - 36.0 g/dL   RDW 11.6 11.5 - 15.5 %   Platelets 204 150 - 400 K/uL   nRBC 0.0 0.0 - 0.2 %    DG Tibia/Fibula Right  Result Date: 09/16/2022 CLINICAL DATA:  ORIF of right tibia. EXAM: RIGHT TIBIA AND FIBULA - 2 VIEW COMPARISON:  09/15/2022 FINDINGS: Intraoperative imaging obtained  with a C-arm demonstrates intramedullary nail placement in the right tibia with anatomic alignment at the level of the distal diaphyseal fracture. Visualized comminuted proximal fibular fracture shows slightly less displacement. IMPRESSION: Anatomic alignment at the level of the distal right tibial diaphyseal fracture after ORIF. Electronically Signed   By: Aletta Edouard M.D.   On: 09/16/2022 15:27   DG C-Arm 1-60 Min-No Report  Result Date: 09/16/2022 Fluoroscopy was utilized by the requesting physician.  No radiographic interpretation.   DG C-Arm 1-60 Min-No Report  Result Date: 09/16/2022 Fluoroscopy was utilized by the requesting physician.  No radiographic interpretation.   CT HEAD WO CONTRAST (5MM)  Result Date: 09/15/2022 CLINICAL DATA:  Head trauma, moderate-severe Fall from ladder. EXAM: CT HEAD WITHOUT CONTRAST TECHNIQUE: Contiguous axial images were obtained from the base of the skull through the vertex without intravenous contrast. RADIATION DOSE REDUCTION: This exam was performed according to the departmental dose-optimization program which includes automated exposure control, adjustment  of the mA and/or kV according to patient size and/or use of iterative reconstruction technique. COMPARISON:  Head CT 06/21/2022 FINDINGS: Brain: Streak artifact from bilateral cochlear implants. No acute intracranial hemorrhage. No subdural or extra-axial collection. No hydrocephalus, midline shift or mass lesion/mass effect. Vascular: No hyperdense vessel or unexpected calcification. Skull: No fracture or focal lesion. Sinuses/Orbits: Bilateral cochlear implants. Minimal opacification of lower mastoid air cells, chronic. Mucosal thickening throughout the paranasal sinuses. Mucous retention cyst in the right maxillary sinus. Other: No confluent scalp hematoma. IMPRESSION: 1. No acute intracranial abnormality. No skull fracture. 2. Bilateral cochlear implants. Electronically Signed   By: Keith Rake  M.D.   On: 09/15/2022 17:35   DG Femur Min 2 Views Right  Result Date: 09/15/2022 CLINICAL DATA:  Right leg pain after fall from ladder. EXAM: RIGHT FEMUR 2 VIEWS COMPARISON:  None Available. FINDINGS: There is no evidence of fracture or other focal bone lesions. Soft tissues are unremarkable. IMPRESSION: Negative. Electronically Signed   By: Marijo Conception M.D.   On: 09/15/2022 13:10   CT PELVIS WO CONTRAST  Result Date: 09/15/2022 CLINICAL DATA:  [place] 8 feet from a ladder.  Right hip pain EXAM: CT PELVIS WITHOUT CONTRAST TECHNIQUE: Multidetector CT imaging of the pelvis was performed following the standard protocol without intravenous contrast. RADIATION DOSE REDUCTION: This exam was performed according to the departmental dose-optimization program which includes automated exposure control, adjustment of the mA and/or kV according to patient size and/or use of iterative reconstruction technique. COMPARISON:  Pelvis radiography same day. FINDINGS: Urinary Tract:  No evidence of urinary tract injury. Bowel:  No evidence of bowel injury. Vascular/Lymphatic: No significant vascular finding.  No adenopathy. Reproductive:  Normal Other:  No free fluid or air. Musculoskeletal: No evidence of sacral fracture. 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 No evidence of acute pelvic or hip fracture. IMPRESSION: 1. No acute or traumatic finding. 2. 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 Electronically Signed   By: Nelson Chimes M.D.   On: 09/15/2022 13:00   CT Lumbar Spine Wo Contrast  Result Date: 09/15/2022 CLINICAL DATA:  [place] [address] 8 feet from a ladder. EXAM: CT LUMBAR SPINE WITHOUT CONTRAST TECHNIQUE: Multidetector CT imaging of the lumbar spine was performed without intravenous contrast administration. Multiplanar CT image reconstructions were also generated. RADIATION DOSE REDUCTION: This exam was performed according to the departmental  dose-optimization program which includes automated exposure control, adjustment of the mA and/or kV according to patient size and/or use of iterative reconstruction technique. COMPARISON:  Lumbar radiography 07/02/2012.  CT 03/16/2021. FINDINGS: Segmentation: 5 lumbar type vertebral bodies. Alignment: Chronic spondylolisthesis at L5-S1 due to bilateral pars defects, measuring 7 mm. Vertebrae: No acute fracture. Chronic bilateral pars defects at L5. Chronic inferior endplate Schmorl's node at L4. Paraspinal and other soft tissues: Negative Disc levels: No significant finding at L3-4 or above. L4-5: Mild bulging of the disc.  No stenosis. L5-S1: Chronic bilateral pars defects with unchanged anterolisthesis at 7 mm. Disc degeneration and bulging. No canal stenosis. Bilateral foraminal narrowing with some potential to affect the exiting L5 nerves. IMPRESSION: 1. No acute or traumatic finding. 2. Chronic bilateral pars defects at L5 with 7 mm of anterolisthesis. Disc degeneration and bulging. No canal stenosis. Bilateral foraminal narrowing with some potential to affect the exiting L5 nerves. Electronically Signed   By: Nelson Chimes M.D.   On: 09/15/2022 12:57   DG Foot 2 Views Right  Result  Date: 09/15/2022 CLINICAL DATA:  Fell 8 feet from ladder EXAM: RIGHT FOOT - 2 VIEW COMPARISON:  None Available. FINDINGS: Frontal and lateral views of the right foot are obtained. There are no acute displaced fractures. Joint spaces are well preserved. Soft tissues are unremarkable. IMPRESSION: 1. Unremarkable right foot. Electronically Signed   By: Randa Ngo M.D.   On: 09/15/2022 10:45   DG Tibia/Fibula Right  Result Date: 09/15/2022 CLINICAL DATA:  A 42 year old male presents with fall from ladder. EXAM: RIGHT TIBIA AND FIBULA - 2 VIEW COMPARISON:  None available FINDINGS: Comminuted fracture of the proximal fibular diaphysis with over riding and angulation. Approximately 2-3 cm of over riding at the fracture site.  Distal spiral tibial fracture with shaft with medial displacement of distal fracture fragment and 2-3 cm of overriding as well at this level. Apex lateral angulation at the fracture site. Fracture occurs in the distal tibial diaphysis. Extensive soft tissue swelling surrounds sites of fracture and is generalized throughout the lower extremity. IMPRESSION: 1. Comminuted fracture of the proximal fibula with over riding and displacement associated with spiral fracture of the distal tibia with moderate to marked displacement, mild angulation and overriding, foreshortening the LEFT lower leg and associated with extensive soft tissue swelling. Electronically Signed   By: Zetta Bills M.D.   On: 09/15/2022 09:34   DG Pelvis 1-2 Views  Result Date: 09/15/2022 CLINICAL DATA:  Fall from ladder yesterday, pain EXAM: PELVIS - 1-2 VIEW COMPARISON:  None Available. FINDINGS: There is no evidence of displaced pelvic fracture or diastasis. No pelvic bone lesions are seen. IMPRESSION: No displaced fracture or dislocation of the pelvis or bilateral proximal femurs seen in single frontal view. Please note that plain radiographs are significantly insensitive for hip and pelvic fracture. Recommend CT to more sensitively evaluate if there is high clinical suspicion for fracture. Electronically Signed   By: Delanna Ahmadi M.D.   On: 09/15/2022 09:32    Assessment/Plan: 1 Day Post-Op   Principal Problem:   Closed fracture of proximal fibula_right Active Problems:   Abnormal LFTs   AA (alcohol abuse)   Tobacco use disorder   COPD (chronic obstructive pulmonary disease) (HCC)   Closed fracture of distal tibia_right   Fall at home, initial encounter   Depression with anxiety   HLD (hyperlipidemia)   Up with therapy NWB RLE Okay to d/c from ortho standpoint Wheel chair with leg elevation needed Follow up in office in 2 weeks ( FEB 13 or 14 with Carlynn Spry PA-C  ) call to confirm appointment [phone] Office  [address] [place] [address] Pain meds sent to pharmacy  Carlynn Spry , PA-C 09/17/2022, 6:44 AM

## 2022-09-17 NOTE — Progress Notes (Signed)
Physical Therapy Treatment Patient Details Name: Jonathan [name] MRN: [id number] DOB: 03/19/1981 Today's Date: 09/17/2022   History of Present Illness Pt is a 42 year old male s/p R tibial IMN 09/16/22 for a right tib/fib fracture after fall from an 8 foot ladder; PMH significant for alcohol abuse, tobacco abuse, HLD, COPD, depression with anxiety, panic disorder, pneumothorax, rib fracture, Barsony-Polgar syndrome, HOH    PT Comments    Patient received in bed, he is agreeable to PT session. Patient is cooperative and motivated. He is independent with bed mobility, transfers with supervision. Patient ambulated 75 feet with RW and cga. Ambulated up/down 1/2 step and full step with min guard assist and RW backward. He is making good progress with mobility and will continue to benefit from skilled PT to improve independence and safety for return home.     Recommendations for follow up therapy are one component of a multi-disciplinary discharge planning process, led by the attending physician.  Recommendations may be updated based on patient status, additional functional criteria and insurance authorization.  Follow Up Recommendations  Home health PT     Assistance Recommended at Discharge Intermittent Supervision/Assistance  Patient can return home with the following A little help with walking and/or transfers;A little help with bathing/dressing/bathroom;Assist for transportation;Help with stairs or ramp for entrance;Assistance with Education officer, environmental (measurements PT);Wheelchair cushion (measurements PT);Rolling walker (2 wheels);BSC/3in1    Recommendations for Other Services Rehab consult     Precautions / Restrictions Precautions Precautions: Fall Restrictions Weight Bearing Restrictions: Yes RLE Weight Bearing: Touchdown weight bearing LLE Weight Bearing:  (TTWB (orders) vs NWBing (note)- secure chatted for confirmation, treated as NWBing)      Mobility  Bed Mobility Overal bed mobility: Modified Independent Bed Mobility: Supine to Sit, Sit to Supine     Supine to sit: Modified independent (Device/Increase time), HOB elevated     General bed mobility comments: no assist needed for bed mobility    Transfers Overall transfer level: Needs assistance Equipment used: None Transfers: Sit to/from Stand Sit to Stand: Supervision     Squat pivot transfers: Supervision    Lateral/Scoot Transfers: Supervision General transfer comment: Patient able to stand from bed with supervision. Good balance, NWB R LE    Ambulation/Gait Ambulation/Gait assistance: Supervision Gait Distance (Feet): 75 Feet Assistive device: Rolling walker (2 wheels) Gait Pattern/deviations: Step-to pattern Gait velocity: decr     General Gait Details: Patient ambulated out in hallway with RW and supervision/cga. Good ability to maintain NWB on right LE and no LOB   Stairs Stairs: Yes Stairs assistance: Min guard Stair Management: Backwards, With walker Number of Stairs: 2 General stair comments: patient has very good upper body strength and did well ambulating up 1/2 step and full step backward with cga.   Wheelchair Mobility    Modified Rankin (Stroke Patients Only)       Balance Overall balance assessment: Needs assistance Sitting-balance support: Feet supported Sitting balance-Leahy Scale: Normal     Standing balance support: Bilateral upper extremity supported, During functional activity, Reliant on assistive device for balance Standing balance-Leahy Scale: Good Standing balance comment: patient has good balance with ambulation of short distances. Needs hands on assist with steps for safety                            Cognition Arousal/Alertness: Awake/alert Behavior During Therapy: WFL for tasks assessed/performed Overall Cognitive Status: Within Functional Limits for  tasks assessed                          Following Commands: Follows one step commands consistently     Problem Solving: Requires tactile cues, Requires verbal cues          Exercises      General Comments General comments (skin integrity, edema, etc.): vital signs monitored, appear stable throughout      Pertinent Vitals/Pain Pain Assessment Pain Assessment: Faces Faces Pain Scale: Hurts a little bit Pain Location: RLE Pain Descriptors / Indicators: Discomfort, Sore Pain Intervention(s): Monitored during session    Home Living Family/patient expects to be discharged to:: Private residence Living Arrangements: Alone Available Help at Discharge: Family;Available 24 hours/day;Other (Comment) (patient lives alone, mother told OT that she can provide supervision if needed, but not physical assist.) Type of Home: House Home Access: Stairs to enter Entrance Stairs-Rails: Left Entrance Stairs-Number of Steps: 2   Home Layout: One level Home Equipment: None Additional Comments: Pt works and drives    Prior Function            PT Goals (current goals can now be found in the care plan section) Acute Rehab PT Goals Patient Stated Goal: to go home PT Goal Formulation: With patient Time For Goal Achievement: 10/01/22 Potential to Achieve Goals: Good Progress towards PT goals: Progressing toward goals    Frequency    BID      PT Plan Discharge plan needs to be updated    Co-evaluation              AM-PAC PT "6 Clicks" Mobility   Outcome Measure  Help needed turning from your back to your side while in a flat bed without using bedrails?: None Help needed moving from lying on your back to sitting on the side of a flat bed without using bedrails?: None Help needed moving to and from a bed to a chair (including a wheelchair)?: A Little Help needed standing up from a chair using your arms (e.g., wheelchair or bedside chair)?: A Little Help needed to walk in hospital room?: A Little Help needed  climbing 3-5 steps with a railing? : A Little 6 Click Score: 20    End of Session Equipment Utilized During Treatment: Gait belt Activity Tolerance: Patient tolerated treatment well Patient left: in bed;with call bell/phone within reach Nurse Communication: Mobility status PT Visit Diagnosis: Other abnormalities of gait and mobility (R26.89);Repeated falls (R29.6);Difficulty in walking, not elsewhere classified (R26.2);Pain Pain - Right/Left: Right Pain - part of body: Leg     Time: 8099-8338 PT Time Calculation (min) (ACUTE ONLY): 18 min  Charges:  $Gait Training: 8-22 mins $Therapeutic Activity: 8-22 mins                     Yariela Tison, PT, GCS 09/17/22,1:45 PM

## 2022-09-17 NOTE — Progress Notes (Signed)
PROGRESS NOTE    Jonathan [name]  ION:[id number] DOB: July 05, 1981 DOA: 09/15/2022 PCP: Ellene Route     Brief Narrative:   From admission h and p  Jonathan [name] is a 42 y.o. male with medical history significant of hear of hearing, alcohol abuse, tobacco abuse, HLD, COPD, depression with anxiety, panic disorder, pneumothorax, rib fracture, Barsony-Polgar syndrome, who presents with fall and right leg pain.   Pt states that he accidentally fell yesterday from an 8 foot ladder, injuring his right leg, developed pain in right leg and ankle.  The pain is constant, severe, sharp, nonradiating.  He has not been able to ambulate since injury. He states he crawled inside the house.  He denies head or neck injury.  No loss of consciousness.  Patient does not have chest pain, cough, shortness breath.  No nausea vomiting, diarrhea or abdominal pain.  No symptoms of UTI.  Patient has chronic severe hard of hearing.  He has hearing aids. Patient admits to alcohol use but denies any recreational drugs.    Assessment & Plan:   Principal Problem:   Closed fracture of proximal fibula_right Active Problems:   Closed fracture of distal tibia_right   Fall at home, initial encounter   COPD (chronic obstructive pulmonary disease) (HCC)   HLD (hyperlipidemia)   Depression with anxiety   Abnormal LFTs   AA (alcohol abuse)   Tobacco use disorder  # Closed right tib/fib fracture No other apparent significant injury from fall from ladder, remainder of imaging including neuroimaging is negative. Now s/p right intramedullary tibial nail with Dr. Harlow Mares of ortho on 1/31.  - pt/ot advising hh with DME, those orders are in.  - lovenox while inpatient, will plan on 4 wks aspirin bid for dvt ppx at discharge - f/u 2 wks ortho (FEB 13 or 14 with Carlynn Spry PA-C ) call to confirm appointment [phone] Office [place] [address]  # Alcohol abuse Denies history of withdrawal, no s/s  withdrawal currently - monitor - vitamins  # COPD - quiescent  # HTN Patient reports not on home meds but interested in starting. 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  # GAD - cont home valium, clarifies he's no longer on paxil     DVT prophylaxis: lovenox Code Status: full code Family Communication: mother updated @ bedside 1/31  Level of care: Med-Surg Status is: Inpatient Remains inpatient appropriate because: unsafe d/c plan, can d/c tomorrow    Consultants:  ortho  Antimicrobials:  Surgical ppx    Subjective: Right leg pain stable, no other complaints  Objective: Vitals:   09/16/22 2315 09/17/22 0523 09/17/22 0746 09/17/22 1159  BP: 122/74 134/82 128/84 130/81  Pulse: (!) 107 92 91 97  Resp: 17 17 16 15   Temp: 98.1 F (36.7 C) 98 F (36.7 C) 98.1 F (36.7 C) 98.3 F (36.8 C)  TempSrc:      SpO2: 94% 99% 96% 96%  Weight:      Height:        Intake/Output Summary (Last 24 hours) at 09/17/2022 1423 Last data filed at 09/17/2022 1305 Gross per 24 hour  Intake 1929.45 ml  Output 3950 ml  Net -2020.55 ml   Filed Weights   09/15/22 0853 09/16/22 1210  Weight: 99.8 kg 99.8 kg    Examination:  General exam: Appears calm and comfortable  Respiratory system: Clear to auscultation.  Respiratory effort normal. Cardiovascular system: S1 & S2 heard, RRR. No JVD, murmurs, rubs, gallops or clicks. No pedal edema. Gastrointestinal system: Abdomen is nondistended, soft and nontender. No organomegaly or masses felt. Normal bowel sounds heard. Central nervous system: Alert and oriented. No focal neurological deficits. Extremities: bandage rle Skin: No rashes, lesions or ulcers Psychiatry: Judgement and insight appear normal. Mood & affect appropriate.     Data Reviewed: I have personally reviewed following labs and imaging studies  CBC: Recent Labs  Lab 09/15/22 0821  09/16/22 0558 09/17/22 0456  WBC 11.8* 7.7 11.6*  NEUTROABS 9.6*  --   --   HGB 17.1* 13.8 12.6*  HCT 50.7 40.3 38.1*  MCV 92.5 93.9 95.5  PLT 294 215 329   Basic Metabolic Panel: Recent Labs  Lab 09/15/22 0821 09/16/22 0558 09/17/22 0456  NA 134* 134* 136  K 4.2 4.4 4.6  CL 99 98 101  CO2 21* 28 27  GLUCOSE 121* 110* 132*  BUN 11 14 14   CREATININE 0.98 1.07 1.01  CALCIUM 8.7* 8.6* 8.5*   GFR: Estimated Creatinine Clearance: 115.4 mL/min (by C-G formula based on SCr of 1.01 mg/dL). Liver Function Tests: Recent Labs  Lab 09/15/22 0821 09/17/22 0456  AST 65* 38  ALT 79* 43  ALKPHOS 91 74  BILITOT 0.8 0.9  PROT 7.9 6.8  ALBUMIN 4.3 3.6   No results for input(s): "LIPASE", "AMYLASE" in the last 168 hours. No results for input(s): "AMMONIA" in the last 168 hours. Coagulation Profile: Recent Labs  Lab 09/15/22 1641  INR 1.0   Cardiac Enzymes: No results for input(s): "CKTOTAL", "CKMB", "CKMBINDEX", "TROPONINI" in the last 168 hours. BNP (last 3 results) No results for input(s): "PROBNP" in the last 8760 hours. HbA1C: No results for input(s): "HGBA1C" in the last 72 hours. CBG: No results for input(s): "GLUCAP" in the last 168 hours. Lipid Profile: No results for input(s): "CHOL", "HDL", "LDLCALC", "TRIG", "CHOLHDL", "LDLDIRECT" in the last 72 hours. Thyroid Function Tests: No results for input(s): "TSH", "T4TOTAL", "FREET4", "T3FREE", "THYROIDAB" in the last 72 hours. Anemia Panel: No results for input(s): "VITAMINB12", "FOLATE", "FERRITIN", "TIBC", "IRON", "RETICCTPCT" in the last 72 hours. Urine analysis:    Component Value Date/Time   COLORURINE YELLOW (A) 09/15/2022 0848   APPEARANCEUR HAZY (A) 09/15/2022 0848   APPEARANCEUR Hazy (A) 04/15/2021 1544   LABSPEC 1.015 09/15/2022 0848   LABSPEC 1.002 07/02/2012 1558   PHURINE 5.0 09/15/2022 0848   GLUCOSEU NEGATIVE 09/15/2022 0848   GLUCOSEU Negative 07/02/2012 1558   HGBUR NEGATIVE 09/15/2022 0848    BILIRUBINUR NEGATIVE 09/15/2022 0848   BILIRUBINUR Negative 04/15/2021 1544   BILIRUBINUR Negative 07/02/2012 1558   KETONESUR NEGATIVE 09/15/2022 0848   PROTEINUR NEGATIVE 09/15/2022 0848   UROBILINOGEN 1.0 10/16/2008 1501   NITRITE NEGATIVE 09/15/2022 0848   LEUKOCYTESUR SMALL (A) 09/15/2022 0848   LEUKOCYTESUR Negative 07/02/2012 1558   Sepsis Labs: @LABRCNTIP (procalcitonin:4,lacticidven:4)  ) Recent Results (from the past 240 hour(s))  Surgical PCR screen     Status: Abnormal   Collection Time: 09/16/22 12:40 AM   Specimen: Nasal Mucosa; Nasal Swab  Result Value Ref Range Status   MRSA, PCR POSITIVE (A) NEGATIVE Final    Comment: RESULT CALLED TO, READ BACK BY AND VERIFIED WITH: Murriel Hopper @0247  on 09/16/22 SKL    Staphylococcus aureus POSITIVE (A) NEGATIVE Final    Comment: (NOTE) The Xpert SA Assay (FDA approved for NASAL specimens in patients 37 years of age and older), is one component of  a comprehensive surveillance program. It is not intended to diagnose infection nor to guide or monitor treatment. Performed at [facility], [address] [place],  [address]          Radiology Studies: DG Tibia/Fibula Right  Result Date: 09/16/2022 CLINICAL DATA:  ORIF of right tibia. EXAM: RIGHT TIBIA AND FIBULA - 2 VIEW COMPARISON:  09/15/2022 FINDINGS: Intraoperative imaging obtained with a C-arm demonstrates intramedullary nail placement in the right tibia with anatomic alignment at the level of the distal diaphyseal fracture. Visualized comminuted proximal fibular fracture shows slightly less displacement. IMPRESSION: Anatomic alignment at the level of the distal right tibial diaphyseal fracture after ORIF. Electronically Signed   By: Aletta Edouard M.D.   On: 09/16/2022 15:27   DG C-Arm 1-60 Min-No Report  Result Date: 09/16/2022 Fluoroscopy was utilized by the requesting physician.  No radiographic interpretation.   DG C-Arm 1-60 Min-No  Report  Result Date: 09/16/2022 Fluoroscopy was utilized by the requesting physician.  No radiographic interpretation.   CT HEAD WO CONTRAST (5MM)  Result Date: 09/15/2022 CLINICAL DATA:  Head trauma, moderate-severe Fall from ladder. EXAM: CT HEAD WITHOUT CONTRAST TECHNIQUE: Contiguous axial images were obtained from the base of the skull through the vertex without intravenous contrast. RADIATION DOSE REDUCTION: This exam was performed according to the departmental dose-optimization program which includes automated exposure control, adjustment of the mA and/or kV according to patient size and/or use of iterative reconstruction technique. COMPARISON:  Head CT 06/21/2022 FINDINGS: Brain: Streak artifact from bilateral cochlear implants. No acute intracranial hemorrhage. No subdural or extra-axial collection. No hydrocephalus, midline shift or mass lesion/mass effect. Vascular: No hyperdense vessel or unexpected calcification. Skull: No fracture or focal lesion. Sinuses/Orbits: Bilateral cochlear implants. Minimal opacification of lower mastoid air cells, chronic. Mucosal thickening throughout the paranasal sinuses. Mucous retention cyst in the right maxillary sinus. Other: No confluent scalp hematoma. IMPRESSION: 1. No acute intracranial abnormality. No skull fracture. 2. Bilateral cochlear implants. Electronically Signed   By: Keith Rake M.D.   On: 09/15/2022 17:35        Scheduled Meds:  amLODipine  5 mg Oral Daily   Chlorhexidine Gluconate Cloth  6 each Topical Q0600   docusate sodium  100 mg Oral BID   feeding supplement  237 mL Oral BID BM   folic acid  1 mg Oral Daily   lidocaine  1 patch Transdermal Q24H   mometasone-formoterol  2 puff Inhalation BID   multivitamin with minerals  1 tablet Oral Daily   mupirocin ointment  1 Application Nasal BID   nicotine  21 mg Transdermal Daily   polyethylene glycol  17 g Oral Daily   rosuvastatin  10 mg Oral Daily   thiamine  100 mg Oral Daily    Or   thiamine  100 mg Intravenous Daily   Continuous Infusions:  methocarbamol (ROBAXIN) IV       [name]: 2 days     Desma Maxim, MD [facility]   If 7PM-7AM, please contact night-coverage [email or web address] Password [facility] 09/17/2022, 2:23 PM
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
## 2022-09-18 MED ORDER — ASPIRIN 81 MG PO TBEC: 81.0000 mg | DELAYED_RELEASE_TABLET | Freq: Two times a day (BID) | ORAL | 0 refills | Status: AC

## 2022-09-18 MED ORDER — AMLODIPINE BESYLATE 5 MG PO TABS: 5.0000 mg | ORAL_TABLET | Freq: Every day | ORAL | 0 refills | Status: AC

## 2022-09-18 NOTE — Progress Notes (Signed)
Physical Therapy Treatment Patient Details Name: Jonathan [name] MRN: [id number] DOB: 23-May-1981 Today's Date: 09/18/2022   History of Present Illness Pt is a 42 year old male s/p R tibial IMN 09/16/22 for a right tib/fib fracture after fall from an 8 foot ladder; PMH significant for alcohol abuse, tobacco abuse, HLD, COPD, depression with anxiety, panic disorder, pneumothorax, rib fracture, Barsony-Polgar syndrome, HOH    PT Comments    Pt was long sitting in bed upon arriving. He is A and O x 4. Premedicated prior to session and agreeable to session. He did not require physical assistance to safely exit bed, stand to RW or hop short distances. Pt was educated on w/c parts and demonstrated safe abilities to self propel throughout hallways and tight spaces without assistance. Pt self propelled w/c to rehab gym and performed stairs with RW. Performed hopping up stairs backwards while maintaining TWB. Overall pt is doing well from a PT standpoint. He is cleared from our standpoint for safe DC home when cleared medically.    Recommendations for follow up therapy are one component of a multi-disciplinary discharge planning process, led by the attending physician.  Recommendations may be updated based on patient status, additional functional criteria and insurance authorization.  Follow Up Recommendations  Home health PT     Assistance Recommended at Discharge Intermittent Supervision/Assistance  Patient can return home with the following A little help with walking and/or transfers;A little help with bathing/dressing/bathroom;Assist for transportation;Help with stairs or ramp for entrance;Assistance with cooking/housework   Equipment Recommendations  None recommended by PT (pt has recieved all equipment needs)       Precautions / Restrictions Precautions Precautions: Fall Restrictions Weight Bearing Restrictions: Yes RLE Weight Bearing: Touchdown weight bearing     Mobility  Bed  Mobility Overal bed mobility: Modified Independent   Transfers Overall transfer level: Modified independent Equipment used: Rolling walker (2 wheels) Transfers: Sit to/from Stand Sit to Stand: Supervision   General transfer comment: pt demonstarted safe ability to stand and exit bed without physical assistance or vcs    Ambulation/Gait Ambulation/Gait assistance: Supervision Gait Distance (Feet): 15 Feet Assistive device: Rolling walker (2 wheels) Gait Pattern/deviations: Step-to pattern Gait velocity: decr   General Gait Details: pt demonstrated safe ability to "hop" 15 ft without LOB or safety concern.   Stairs Stairs: Yes Stairs assistance: Min guard Stair Management: Backwards, With walker Number of Stairs: 4 General stair comments: pt was able to ascend/descend 4 stair with use of RW/ backwards. Educated on need to have someone with him when performing stairs at home environment. Pt endorses have 24/7 available assistance.   Information systems manager mobility: Yes Wheelchair propulsion: Both upper extremities Wheelchair parts: Supervision/cueing Distance: 200 Wheelchair Assistance Details (indicate cue type and reason): Pt was educated on parts of w/c and how to use them. He was able to safely and independently self propel w/c safely. He demonstrated good understanding/ safety with locking/unlocking w/c prior/post to standing to RW.    Balance Overall balance assessment: Needs assistance Sitting-balance support: Feet supported Sitting balance-Leahy Scale: Normal     Standing balance support: Bilateral upper extremity supported, During functional activity, Reliant on assistive device for balance Standing balance-Leahy Scale: Good     Cognition Arousal/Alertness: Awake/alert Behavior During Therapy: WFL for tasks assessed/performed Overall Cognitive Status: Within Functional Limits for tasks assessed    Following Commands: Follows one  step commands consistently Safety/Judgement: Decreased awareness of deficits, Decreased awareness of safety  General Comments: Pt is A and O x 4. uware of restrictions and able to maintain throughout.               Pertinent Vitals/Pain Pain Assessment Pain Assessment: 0-10 Pain Score: 6  Pain Location: RLE Pain Descriptors / Indicators: Discomfort, Sore Pain Intervention(s): Limited activity within patient's tolerance, Monitored during session, Premedicated before session, Repositioned     PT Goals (current goals can now be found in the care plan section) Acute Rehab PT Goals Patient Stated Goal: to go home Progress towards PT goals: Progressing toward goals    Frequency    BID      PT Plan Current plan remains appropriate       AM-PAC PT "6 Clicks" Mobility   Outcome Measure  Help needed turning from your back to your side while in a flat bed without using bedrails?: None Help needed moving from lying on your back to sitting on the side of a flat bed without using bedrails?: None Help needed moving to and from a bed to a chair (including a wheelchair)?: A Little Help needed standing up from a chair using your arms (e.g., wheelchair or bedside chair)?: A Little Help needed to walk in hospital room?: A Little Help needed climbing 3-5 steps with a railing? : A Little 6 Click Score: 20    End of Session Equipment Utilized During Treatment: Gait belt Activity Tolerance: Patient tolerated treatment well Patient left: in bed;with call bell/phone within reach Nurse Communication: Mobility status PT Visit Diagnosis: Other abnormalities of gait and mobility (R26.89);Repeated falls (R29.6);Difficulty in walking, not elsewhere classified (R26.2);Pain Pain - Right/Left: Right Pain - part of body: Leg     Time: 6301-6010 PT Time Calculation (min) (ACUTE ONLY): 26 min  Charges:  $Gait Training: 8-22 mins $Wheel Chair Management: 8-22 mins                     Julaine Fusi PTA 09/18/22, 11:13 AM

## 2022-09-18 NOTE — Discharge Summary (Signed)
Jonathan [name] YSA:[id number] DOB: 1980/10/23 DOA: 09/15/2022  PCP: Ellene Route  Admit date: 09/15/2022 Discharge date: 09/18/2022  Time spent: 35 minutes  Recommendations for Outpatient Follow-up:  Orthopedics f/u 2 weeks     Discharge Diagnoses:  Principal Problem:   Closed fracture of proximal fibula_right Active Problems:   Closed fracture of distal tibia_right   Fall at home, initial encounter   COPD (chronic obstructive pulmonary disease) ([place])   HLD (hyperlipidemia)   Depression with anxiety   Abnormal LFTs   AA (alcohol abuse)   Tobacco use disorder   Discharge Condition: stable  Diet recommendation: heart healthy  Filed Weights   09/15/22 0853 09/16/22 1210  Weight: 99.8 kg 99.8 kg    History of present illness:  From admission h and p HERNAN TURNAGE is a 42 y.o. male with medical history significant of hear of hearing, alcohol abuse, tobacco abuse, HLD, COPD, depression with anxiety, panic disorder, pneumothorax, rib fracture, Barsony-Polgar syndrome, who presents with fall and right leg pain.   Pt states that he accidentally fell yesterday from an 8 foot ladder, injuring his right leg, developed pain in right leg and ankle.  The pain is constant, severe, sharp, nonradiating.  He has not been able to ambulate since injury. He states he crawled inside the house.  He denies head or neck injury.  No loss of consciousness.  Patient does not have chest pain, cough, shortness breath.  No nausea vomiting, diarrhea or abdominal pain.  No symptoms of UTI.  Patient has chronic severe hard of hearing.  He has hearing aids. Patient admits to alcohol use but denies any recreational drugs.   Hospital Course:  Patient presented with right tib/fib fracture after fall from ladder while intoxicated. Ortho consulted, performed right intramedullary nail repair with Dr. Harlow Mares on 1/31. PT/OT evaluated, advised home health pt/ot which has been ordered. Wheelchair, bedside commode, and  rolling walker. He is to be non-weighbearing RLE at least until ortho f/u in 2 weeks. He received substance abuse counseling. BP elevated and patient reports he has htn but is not on home meds - I started amlodipine, advised pcp f/u.   Procedures: See above   Consultations: orthopedics  Discharge Exam: Vitals:   09/17/22 2325 09/18/22 0827  BP: (!) 153/92 (!) 142/93  Pulse: (!) 103 100  Resp: 20 18  Temp: 98.2 F (36.8 C) 98.5 F (36.9 C)  SpO2: 94% 95%    General exam: Appears calm and comfortable  Respiratory system: Clear to auscultation. Respiratory effort normal. Cardiovascular system: S1 & S2 heard, RRR. No JVD, murmurs, rubs, gallops or clicks. No pedal edema. Gastrointestinal system: Abdomen is nondistended, soft and nontender. No organomegaly or masses felt. Normal bowel sounds heard. Central nervous system: Alert and oriented. No focal neurological deficits. Extremities: bandage rle Skin: No rashes, lesions or ulcers Psychiatry: Judgement and insight appear normal. Mood & affect appropriate.   Discharge Instructions   Discharge Instructions     Diet - low sodium heart healthy   Complete by: As directed    Increase activity slowly   Complete by: As directed    Wheelchair   Complete by: As directed       Allergies as of 09/18/2022   No Known Allergies      Medication List     STOP taking these medications    guaiFENesin-dextromethorphan 100-10 MG/5ML syrup Commonly known as: ROBITUSSIN DM   HYDROcodone-acetaminophen 5-325 MG tablet Commonly known as: NORCO/VICODIN   lidocaine 5 %  Commonly known as: LIDODERM   PARoxetine 20 MG tablet Commonly known as: PAXIL       TAKE these medications    amLODipine 5 MG tablet Commonly known as: NORVASC Take 1 tablet (5 mg total) by mouth daily. Start taking on: September 19, 2022   aspirin EC 81 MG tablet Take 1 tablet (81 mg total) by mouth 2 (two) times daily. Swallow whole.   budesonide-formoterol  160-4.5 MCG/ACT inhaler Commonly known as: Symbicort Inhale 2 puffs into the lungs 2 (two) times daily.   diazepam 10 MG tablet Commonly known as: VALIUM Take 10 mg by mouth every 6 (six) hours as needed for anxiety.   docusate sodium 100 MG capsule Commonly known as: COLACE Take 1 capsule (100 mg total) by mouth 2 (two) times daily.   ibuprofen 800 MG tablet Commonly known as: ADVIL Take 800 mg by mouth every 8 (eight) hours as needed for mild pain.   methocarbamol 500 MG tablet Commonly known as: ROBAXIN Take 1 tablet (500 mg total) by mouth every 8 (eight) hours as needed for muscle spasms.   Oxycodone HCl 10 MG Tabs Take 1 tablet (10 mg total) by mouth every 4 (four) hours as needed.   rosuvastatin 10 MG tablet Commonly known as: Crestor Take 1 tablet (10 mg total) by mouth daily.               Durable Medical Equipment  (From admission, onward)           Start     Ordered   09/17/22 1422  For home use only DME Walker rolling  Once       Question Answer Comment  Walker: With [employer] Wheels   Patient needs a walker to treat with the following condition Tibia fracture      09/17/22 1421   09/17/22 1422  For home use only DME Bedside commode  Once       Question:  Patient needs a bedside commode to treat with the following condition  Answer:  Tibia fracture   09/17/22 1421           No Known Allergies  Follow-up Information     Carlynn Spry, PA-C Follow up.   Specialty: Orthopedic Surgery Why: 2 weeks Contact information: [address] [place] [address] [phone]                  The results of significant diagnostics from this hospitalization (including imaging, microbiology, ancillary and laboratory) are listed below for reference.    Significant Diagnostic Studies: DG Tibia/Fibula Right  Result Date: 09/16/2022 CLINICAL DATA:  ORIF of right tibia. EXAM: RIGHT TIBIA AND FIBULA - 2 VIEW COMPARISON:  09/15/2022  FINDINGS: Intraoperative imaging obtained with a C-arm demonstrates intramedullary nail placement in the right tibia with anatomic alignment at the level of the distal diaphyseal fracture. Visualized comminuted proximal fibular fracture shows slightly less displacement. IMPRESSION: Anatomic alignment at the level of the distal right tibial diaphyseal fracture after ORIF. Electronically Signed   By: Aletta Edouard M.D.   On: 09/16/2022 15:27   DG C-Arm 1-60 Min-No Report  Result Date: 09/16/2022 Fluoroscopy was utilized by the requesting physician.  No radiographic interpretation.   DG C-Arm 1-60 Min-No Report  Result Date: 09/16/2022 Fluoroscopy was utilized by the requesting physician.  No radiographic interpretation.   CT HEAD WO CONTRAST (5MM)  Result Date: 09/15/2022 CLINICAL DATA:  Head trauma, moderate-severe Fall from ladder. EXAM: CT HEAD WITHOUT  CONTRAST TECHNIQUE: Contiguous axial images were obtained from the base of the skull through the vertex without intravenous contrast. RADIATION DOSE REDUCTION: This exam was performed according to the departmental dose-optimization program which includes automated exposure control, adjustment of the mA and/or kV according to patient size and/or use of iterative reconstruction technique. COMPARISON:  Head CT 06/21/2022 FINDINGS: Brain: Streak artifact from bilateral cochlear implants. No acute intracranial hemorrhage. No subdural or extra-axial collection. No hydrocephalus, midline shift or mass lesion/mass effect. Vascular: No hyperdense vessel or unexpected calcification. Skull: No fracture or focal lesion. Sinuses/Orbits: Bilateral cochlear implants. Minimal opacification of lower mastoid air cells, chronic. Mucosal thickening throughout the paranasal sinuses. Mucous retention cyst in the right maxillary sinus. Other: No confluent scalp hematoma. IMPRESSION: 1. No acute intracranial abnormality. No skull fracture. 2. Bilateral cochlear implants.  Electronically Signed   By: Narda Rutherford M.D.   On: 09/15/2022 17:35   DG Femur Min 2 Views Right  Result Date: 09/15/2022 CLINICAL DATA:  Right leg pain after fall from ladder. EXAM: RIGHT FEMUR 2 VIEWS COMPARISON:  None Available. FINDINGS: There is no evidence of fracture or other focal bone lesions. Soft tissues are unremarkable. IMPRESSION: Negative. Electronically Signed   By: Lupita Raider M.D.   On: 09/15/2022 13:10   CT PELVIS WO CONTRAST  Result Date: 09/15/2022 CLINICAL DATA:  Larey Seat 8 feet from a ladder.  Right hip pain EXAM: CT PELVIS WITHOUT CONTRAST TECHNIQUE: Multidetector CT imaging of the pelvis was performed following the standard protocol without intravenous contrast. RADIATION DOSE REDUCTION: This exam was performed according to the departmental dose-optimization program which includes automated exposure control, adjustment of the mA and/or kV according to patient size and/or use of iterative reconstruction technique. COMPARISON:  Pelvis radiography same day. FINDINGS: Urinary Tract:  No evidence of urinary tract injury. Bowel:  No evidence of bowel injury. Vascular/Lymphatic: No significant vascular finding.  No adenopathy. Reproductive:  Normal Other:  No free fluid or air. Musculoskeletal: No evidence of sacral fracture. 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 No evidence of acute pelvic or hip fracture. IMPRESSION: 1. No acute or traumatic finding. 2. 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 Electronically Signed   By: Paulina Fusi M.D.   On: 09/15/2022 13:00   CT Lumbar Spine Wo Contrast  Result Date: 09/15/2022 CLINICAL DATA:  Larey Seat 8 feet from a ladder. EXAM: CT LUMBAR SPINE WITHOUT CONTRAST TECHNIQUE: Multidetector CT imaging of the lumbar spine was performed without intravenous contrast administration. Multiplanar CT image reconstructions were also generated. RADIATION DOSE REDUCTION: This exam was performed  according to the departmental dose-optimization program which includes automated exposure control, adjustment of the mA and/or kV according to patient size and/or use of iterative reconstruction technique. COMPARISON:  Lumbar radiography 07/02/2012.  CT 03/16/2021. FINDINGS: Segmentation: 5 lumbar type vertebral bodies. Alignment: Chronic spondylolisthesis at L5-S1 due to bilateral pars defects, measuring 7 mm. Vertebrae: No acute fracture. Chronic bilateral pars defects at L5. Chronic inferior endplate Schmorl's node at L4. Paraspinal and other soft tissues: Negative Disc levels: No significant finding at L3-4 or above. L4-5: Mild bulging of the disc.  No stenosis. L5-S1: Chronic bilateral pars defects with unchanged anterolisthesis at 7 mm. Disc degeneration and bulging. No canal stenosis. Bilateral foraminal narrowing with some potential to affect the exiting L5 nerves. IMPRESSION: 1. No acute or traumatic finding. 2. Chronic bilateral pars defects at L5 with 7 mm of anterolisthesis. Disc degeneration and bulging.  No canal stenosis. Bilateral foraminal narrowing with some potential to affect the exiting L5 nerves. Electronically Signed   By: Nelson Chimes M.D.   On: 09/15/2022 12:57   DG Foot 2 Views Right  Result Date: 09/15/2022 CLINICAL DATA:  Fell 8 feet from ladder EXAM: RIGHT FOOT - 2 VIEW COMPARISON:  None Available. FINDINGS: Frontal and lateral views of the right foot are obtained. There are no acute displaced fractures. Joint spaces are well preserved. Soft tissues are unremarkable. IMPRESSION: 1. Unremarkable right foot. Electronically Signed   By: Randa Ngo M.D.   On: 09/15/2022 10:45   DG Tibia/Fibula Right  Result Date: 09/15/2022 CLINICAL DATA:  A 42 year old male presents with fall from ladder. EXAM: RIGHT TIBIA AND FIBULA - 2 VIEW COMPARISON:  None available FINDINGS: Comminuted fracture of the proximal fibular diaphysis with over riding and angulation. Approximately 2-3 cm of over  riding at the fracture site. Distal spiral tibial fracture with shaft with medial displacement of distal fracture fragment and 2-3 cm of overriding as well at this level. Apex lateral angulation at the fracture site. Fracture occurs in the distal tibial diaphysis. Extensive soft tissue swelling surrounds sites of fracture and is generalized throughout the lower extremity. IMPRESSION: 1. Comminuted fracture of the proximal fibula with over riding and displacement associated with spiral fracture of the distal tibia with moderate to marked displacement, mild angulation and overriding, foreshortening the LEFT lower leg and associated with extensive soft tissue swelling. Electronically Signed   By: Zetta Bills M.D.   On: 09/15/2022 09:34   DG Pelvis 1-2 Views  Result Date: 09/15/2022 CLINICAL DATA:  Fall from ladder yesterday, pain EXAM: PELVIS - 1-2 VIEW COMPARISON:  None Available. FINDINGS: There is no evidence of displaced pelvic fracture or diastasis. No pelvic bone lesions are seen. IMPRESSION: No displaced fracture or dislocation of the pelvis or bilateral proximal femurs seen in single frontal view. Please note that plain radiographs are significantly insensitive for hip and pelvic fracture. Recommend CT to more sensitively evaluate if there is high clinical suspicion for fracture. Electronically Signed   By: Delanna Ahmadi M.D.   On: 09/15/2022 09:32    Microbiology: Recent Results (from the past 240 hour(s))  Surgical PCR screen     Status: Abnormal   Collection Time: 09/16/22 12:40 AM   Specimen: Nasal Mucosa; Nasal Swab  Result Value Ref Range Status   MRSA, PCR POSITIVE (A) NEGATIVE Final    Comment: RESULT CALLED TO, READ BACK BY AND VERIFIED WITH: Murriel Hopper @0247  on 09/16/22 SKL    Staphylococcus aureus POSITIVE (A) NEGATIVE Final    Comment: (NOTE) The Xpert SA Assay (FDA approved for NASAL specimens in patients 70 years of age and older), is one component of a  comprehensive surveillance program. It is not intended to diagnose infection nor to guide or monitor treatment. Performed at [facility], [address] [place], [place] [address]      Labs: Basic Metabolic Panel: Recent Labs  Lab 09/15/22 0821 09/16/22 0558 09/17/22 0456  NA 134* 134* 136  K 4.2 4.4 4.6  CL 99 98 101  CO2 21* 28 27  GLUCOSE 121* 110* 132*  BUN 11 14 14   CREATININE 0.98 1.07 1.01  CALCIUM 8.7* 8.6* 8.5*   Liver Function Tests: Recent Labs  Lab 09/15/22 0821 09/17/22 0456  AST 65* 38  ALT 79* 43  ALKPHOS 91 74  BILITOT 0.8 0.9  PROT 7.9 6.8  ALBUMIN 4.3 3.6  No results for input(s): "LIPASE", "AMYLASE" in the last 168 hours. No results for input(s): "AMMONIA" in the last 168 hours. CBC: Recent Labs  Lab 09/15/22 0821 09/16/22 0558 09/17/22 0456  WBC 11.8* 7.7 11.6*  NEUTROABS 9.6*  --   --   HGB 17.1* 13.8 12.6*  HCT 50.7 40.3 38.1*  MCV 92.5 93.9 95.5  PLT 294 215 204   Cardiac Enzymes: No results for input(s): "CKTOTAL", "CKMB", "CKMBINDEX", "TROPONINI" in the last 168 hours. BNP: BNP (last 3 results) No results for input(s): "BNP" in the last 8760 hours.  ProBNP (last 3 results) No results for input(s): "PROBNP" in the last 8760 hours.  CBG: No results for input(s): "GLUCAP" in the last 168 hours.     Signed:  Silvano Bilis MD.  [facility] 09/18/2022, 10:55 AM

## 2022-10-16 ENCOUNTER — Encounter: Payer: Self-pay | Admitting: Pulmonary Disease
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
## 2023-04-08 ENCOUNTER — Encounter: Payer: Self-pay | Admitting: Pulmonary Disease
